# Patient Record
Sex: Male | Born: 1955 | Hispanic: Yes | Marital: Single | Smoking: Current some day smoker
Health system: Southern US, Community
[De-identification: ages and names within clinical notes are randomized; demographics above are authoritative.]

## PROBLEM LIST (undated history)

## (undated) DIAGNOSIS — Z789 Other specified health status: Secondary | ICD-10-CM

## (undated) DIAGNOSIS — E785 Hyperlipidemia, unspecified: Secondary | ICD-10-CM

## (undated) DIAGNOSIS — I639 Cerebral infarction, unspecified: Secondary | ICD-10-CM

## (undated) DIAGNOSIS — I10 Essential (primary) hypertension: Secondary | ICD-10-CM

## (undated) HISTORY — DX: Cerebral infarction, unspecified: I63.9

## (undated) HISTORY — PX: NO PAST SURGERIES: SHX2092

---

## 2016-04-13 ENCOUNTER — Observation Stay: Payer: Self-pay

## 2016-04-13 ENCOUNTER — Inpatient Hospital Stay
Admission: EM | Admit: 2016-04-13 | Discharge: 2016-04-15 | DRG: 068 | Disposition: A | Discharge status: Other Acute Inpatient Hospital | Payer: Self-pay | Attending: Internal Medicine | Admitting: Internal Medicine

## 2016-04-13 ENCOUNTER — Emergency Department: Payer: Self-pay

## 2016-04-13 ENCOUNTER — Encounter: Payer: Self-pay | Admitting: Emergency Medicine

## 2016-04-13 DIAGNOSIS — G459 Transient cerebral ischemic attack, unspecified: Secondary | ICD-10-CM | POA: Diagnosis present

## 2016-04-13 DIAGNOSIS — R200 Anesthesia of skin: Secondary | ICD-10-CM

## 2016-04-13 DIAGNOSIS — I6621 Occlusion and stenosis of right posterior cerebral artery: Principal | ICD-10-CM | POA: Diagnosis present

## 2016-04-13 DIAGNOSIS — I63531 Cerebral infarction due to unspecified occlusion or stenosis of right posterior cerebral artery: Secondary | ICD-10-CM | POA: Diagnosis present

## 2016-04-13 DIAGNOSIS — E785 Hyperlipidemia, unspecified: Secondary | ICD-10-CM | POA: Diagnosis present

## 2016-04-13 DIAGNOSIS — I10 Essential (primary) hypertension: Secondary | ICD-10-CM | POA: Diagnosis present

## 2016-04-13 DIAGNOSIS — I639 Cerebral infarction, unspecified: Secondary | ICD-10-CM | POA: Diagnosis present

## 2016-04-13 DIAGNOSIS — Z823 Family history of stroke: Secondary | ICD-10-CM

## 2016-04-13 DIAGNOSIS — F17200 Nicotine dependence, unspecified, uncomplicated: Secondary | ICD-10-CM | POA: Diagnosis present

## 2016-04-13 DIAGNOSIS — R29898 Other symptoms and signs involving the musculoskeletal system: Secondary | ICD-10-CM

## 2016-04-13 DIAGNOSIS — G8194 Hemiplegia, unspecified affecting left nondominant side: Secondary | ICD-10-CM | POA: Diagnosis present

## 2016-04-13 HISTORY — DX: Other specified health status: Z78.9

## 2016-04-13 LAB — DIFFERENTIAL
BASOS PCT: 1 %
Basophils Absolute: 0 10*3/uL (ref 0–0.1)
EOS ABS: 0.5 10*3/uL (ref 0–0.7)
EOS PCT: 7 %
Lymphocytes Relative: 23 %
Lymphs Abs: 1.6 10*3/uL (ref 1.0–3.6)
Monocytes Absolute: 0.7 10*3/uL (ref 0.2–1.0)
Monocytes Relative: 9 %
NEUTROS PCT: 60 %
Neutro Abs: 4.3 10*3/uL (ref 1.4–6.5)

## 2016-04-13 LAB — TROPONIN I: Troponin I: <0.03 ng/mL (ref <0.031)

## 2016-04-13 LAB — COMPREHENSIVE METABOLIC PANEL
ALBUMIN: 4.2 g/dL (ref 3.5–5.0)
ALT: 21 U/L (ref 17–63)
ANION GAP: 6 (ref 5–15)
AST: 23 U/L (ref 15–41)
Alkaline Phosphatase: 69 U/L (ref 38–126)
BUN: 20 mg/dL (ref 6–20)
CO2: 24 mmol/L (ref 22–32)
Calcium: 8.9 mg/dL (ref 8.9–10.3)
Chloride: 108 mmol/L (ref 101–111)
Creatinine, Ser: 1.18 mg/dL (ref 0.61–1.24)
GFR calc Af Amer: >60 mL/min (ref >60)
GFR calc non Af Amer: >60 mL/min (ref >60)
GLUCOSE: 120 mg/dL — AB (ref 65–99)
POTASSIUM: 3.7 mmol/L (ref 3.5–5.1)
SODIUM: 138 mmol/L (ref 135–145)
Total Bilirubin: 0.9 mg/dL (ref 0.3–1.2)
Total Protein: 7.6 g/dL (ref 6.5–8.1)

## 2016-04-13 LAB — CBC
HCT: 45.3 % (ref 40.0–52.0)
Hemoglobin: 15.6 g/dL (ref 13.0–18.0)
MCH: 31.1 pg (ref 26.0–34.0)
MCHC: 34.3 g/dL (ref 32.0–36.0)
MCV: 90.5 fL (ref 80.0–100.0)
PLATELETS: 181 10*3/uL (ref 150–440)
RBC: 5.01 MIL/uL (ref 4.40–5.90)
RDW: 13.5 % (ref 11.5–14.5)
WBC: 7.1 10*3/uL (ref 3.8–10.6)

## 2016-04-13 LAB — PROTIME-INR
INR: 0.96
PROTHROMBIN TIME: 13 s (ref 11.4–15.0)

## 2016-04-13 LAB — APTT: aPTT: 29 seconds (ref 24–36)

## 2016-04-13 LAB — ETHANOL: Alcohol, Ethyl (B): <5 mg/dL (ref <5)

## 2016-04-13 MED ORDER — ASPIRIN 81 MG PO CHEW
324.0000 mg | CHEWABLE_TABLET | Freq: Once | ORAL | Status: AC
  Administered 2016-04-13: 324 mg via ORAL
  Filled 2016-04-13: qty 4

## 2016-04-13 MED ORDER — ATORVASTATIN CALCIUM 20 MG PO TABS
40.0000 mg | ORAL_TABLET | Freq: Every day | ORAL | Status: DC
  Administered 2016-04-13: 40 mg via ORAL
  Filled 2016-04-13: qty 2

## 2016-04-13 MED ORDER — ONDANSETRON HCL 4 MG PO TABS: 4.0000 mg | ORAL_TABLET | Freq: Four times a day (QID) | ORAL | Status: DC | PRN

## 2016-04-13 MED ORDER — MORPHINE SULFATE (PF) 2 MG/ML IV SOLN: 2.0000 mg | INTRAVENOUS | Status: DC | PRN

## 2016-04-13 MED ORDER — ASPIRIN 325 MG PO TABS
325.0000 mg | ORAL_TABLET | Freq: Every day | ORAL | Status: DC
Start: 2016-04-13 — End: 2016-04-15
  Administered 2016-04-13 – 2016-04-15 (×3): 325 mg via ORAL
  Filled 2016-04-13 (×3): qty 1

## 2016-04-13 MED ORDER — ONDANSETRON HCL 4 MG/2ML IJ SOLN: 4.0000 mg | Freq: Four times a day (QID) | INTRAMUSCULAR | Status: DC | PRN

## 2016-04-13 MED ORDER — ACETAMINOPHEN 650 MG RE SUPP: 650.0000 mg | Freq: Four times a day (QID) | RECTAL | Status: DC | PRN

## 2016-04-13 MED ORDER — ENOXAPARIN SODIUM 40 MG/0.4ML ~~LOC~~ SOLN
40.0000 mg | SUBCUTANEOUS | Status: DC
  Administered 2016-04-13 – 2016-04-15 (×3): 40 mg via SUBCUTANEOUS
  Filled 2016-04-13 (×3): qty 0.4

## 2016-04-13 MED ORDER — OXYCODONE HCL 5 MG PO TABS
5.0000 mg | ORAL_TABLET | ORAL | Status: DC | PRN
  Administered 2016-04-13: 5 mg via ORAL
  Filled 2016-04-13: qty 1

## 2016-04-13 MED ORDER — SODIUM CHLORIDE 0.9% FLUSH
3.0000 mL | Freq: Two times a day (BID) | INTRAVENOUS | Status: DC
  Administered 2016-04-13 – 2016-04-15 (×5): 3 mL via INTRAVENOUS

## 2016-04-13 MED ORDER — STROKE: EARLY STAGES OF RECOVERY BOOK
Freq: Once | Status: AC
  Administered 2016-04-13: 10:00:00

## 2016-04-13 MED ORDER — ASPIRIN 300 MG RE SUPP: 300.0000 mg | Freq: Every day | RECTAL | Status: DC

## 2016-04-13 MED ORDER — ACETAMINOPHEN 325 MG PO TABS
650.0000 mg | ORAL_TABLET | Freq: Four times a day (QID) | ORAL | Status: DC | PRN
  Administered 2016-04-14: 23:00:00 650 mg via ORAL
  Filled 2016-04-13: qty 2

## 2016-04-13 NOTE — ED Provider Notes (Signed)
[facility] Emergency Department Provider Note  ____________________________________________  Time seen: Approximately 402 AM  I have reviewed the triage vital signs and the nursing notes.   HISTORY  Chief Complaint Weakness and Dizziness    HPI Victor Calhoun is a 60 y.o. male who comes into the hospital today with some left-sided weakness. The patient reports that he feels as though his left side is sleeping. The symptoms are yesterday at 10 AM. He was last normal Saturday night. He thought it would go away so he stayed home. The patient has had some left-sided weakness as well. When he picks things up it just falls. He also reports that when he walks his leg went under him. The patient has not had any slurred speech or facial droop. He has been drinking this weekend quite a few beers. He reports that his hand and his legs feel throbbing but he is not having any pain at this time.The patient's family was concerned as he has a significant family history of stroke. The patient is here for evaluation.   History reviewed. No pertinent past medical history.  There are no active problems to display for this patient.   History reviewed. No pertinent past surgical history.  No current outpatient prescriptions on file.  Allergies Review of patient's allergies indicates no known allergies.  History reviewed. No pertinent family history.  Social History Social History  Substance Use Topics  . Smoking status: Current Some Day Smoker  . Smokeless tobacco: None  . Alcohol Use: Yes    Review of Systems Constitutional: No fever/chills Eyes: No visual changes. ENT: No sore throat. Cardiovascular: Denies chest pain. Respiratory: Denies shortness of breath. Gastrointestinal: No abdominal pain.  No nausea, no vomiting.  No diarrhea.  No constipation. Genitourinary: Negative for dysuria. Musculoskeletal: Negative for back pain. Skin: Negative for  rash. Neurological: Left arm numbness and weakness.  10-point ROS otherwise negative.  ____________________________________________   PHYSICAL EXAM:  VITAL SIGNS: ED Triage Vitals  Enc Vitals Group     BP 04/13/16 0402 160/107 mmHg     Pulse Rate 04/13/16 0402 86     Resp 04/13/16 0402 21     Temp 04/13/16 0402 98.3 F (36.8 C)     Temp Source 04/13/16 0402 Oral     SpO2 04/13/16 0402 96 %     Weight 04/13/16 0402 175 lb (79.379 kg)     Height 04/13/16 0402 5\' 8"  (1.727 m)     Head Cir --      Peak Flow --      Pain Score 04/13/16 0405 6     Pain Loc --      Pain Edu? --      Excl. in [place]? --     Constitutional: Alert and oriented. Well appearing and in no acute distress. Eyes: Conjunctivae are normal. PERRL. EOMI. Head: Atraumatic. Nose: No congestion/rhinnorhea. Mouth/Throat: Mucous membranes are moist.  Oropharynx non-erythematous. Cardiovascular: Normal rate, regular rhythm. Grossly normal heart sounds.  Good peripheral circulation. Respiratory: Normal respiratory effort.  No retractions. Lungs CTAB. Gastrointestinal: Soft and nontender. No distention. Positive bowel sounds Musculoskeletal: No lower extremity tenderness nor edema.   Neurologic:  Normal speech and language. Cranial nerves II through XII are grossly intact. The patient has some decreased sensation in his left upper extremity with some decreased grip and some left-sided pronator drift. The patient also has some mild dysmetria on the left as well. The patient has no lower extremity pronator drift  or numbness. Speech is clear and intact. Skin:  Skin is warm, dry and intact. Psychiatric: Mood and affect are normal.   ____________________________________________   LABS (all labs ordered are listed, but only abnormal results are displayed)  Labs Reviewed  COMPREHENSIVE METABOLIC PANEL - Abnormal; Notable for the following:    Glucose, Bld 120 (*)    All other components within normal limits  PROTIME-INR   APTT  CBC  DIFFERENTIAL  TROPONIN I  ETHANOL  TROPONIN I  CBG MONITORING, ED   ____________________________________________  EKG  ED ECG REPORT I, Loney Hering, the attending physician, personally viewed and interpreted this ECG.   Date: 04/13/2016  EKG Time: 542  Rate: 88  Rhythm: normal sinus rhythm  Axis: normal  Intervals:none  ST&T Change: normal  ____________________________________________  RADIOLOGY  CT head: No acute intracranial abnormalities, mild chronic atrophy and small vessel ischemic changes. ____________________________________________   PROCEDURES  Procedure(s) performed: None  Critical Care performed: No  ____________________________________________   INITIAL IMPRESSION / ASSESSMENT AND PLAN / ED COURSE  Pertinent labs & imaging results that were available during my care of the patient were reviewed by me and considered in my medical decision making (see chart for details).  This is a 60 year old male who comes into the hospital today with some left-sided numbness and weakness. The patient had been drinking and woke up feeling dizzy and unsteady and having the left-sided weakness. The patient had some blood work that was unremarkable including an ethanol. The patient's CT scan does not show any areas of infarct at this time but I feel the patient is being admitted to the hospital for further evaluation and neurologic workup. The patient does have an NIH stroke scale of 3 but as his symptoms are greater than 12 hours old he is outside of the code stroke window. The patient will receive an aspirin. He is outside of the window to receive TPA at this time.  The patient will be admitted to the hospitalist service for further evaluation and workup. ____________________________________________   FINAL CLINICAL IMPRESSION(S) / ED DIAGNOSES  Final diagnoses:  Left arm weakness  Left arm numbness      Loney Hering, MD 04/13/16 [phone]

## 2016-04-13 NOTE — ED Notes (Signed)
Pt states he has some improve, denies HA at present, that he had on arrival.. No noted facial droop.Victor Calhoun

## 2016-04-13 NOTE — Plan of Care (Signed)
Problem: Education: Goal: Knowledge of disease or condition will improve Outcome: Progressing Present on admission numbess in LUE and LLE 04/13/16

## 2016-04-13 NOTE — H&P (Signed)
[facility] [place] NAME: Victor Calhoun    MR#:  [id number]  DATE OF BIRTH:  [birth year]   DATE OF ADMISSION:  04/13/2016  PRIMARY CARE PHYSICIAN: No primary care provider on file.   REQUESTING/REFERRING PHYSICIAN: Dahlia Client  CHIEF COMPLAINT:   Chief Complaint  Patient presents with  . Weakness  . Dizziness    HISTORY OF PRESENT ILLNESS:  Victor Calhoun  is a 60 y.o. male without significant medical history presenting with left-sided weakness. Last known normal [id number] uncertain about time but prior to bed. Patient awoke 04/12/2016 after drinking several beers the night before with dizziness left-sided weakness. This includes arm and leg upper extremity more prominent than lower. Symptoms persisted but somewhat improved though not yet back to baseline. Denies further symptoms at this time other than weakness.  PAST MEDICAL HISTORY:   Past Medical History  Diagnosis Date  . Medical history non-contributory     PAST SURGICAL HISTORY:   Past Surgical History  Procedure Laterality Date  . No past surgeries      SOCIAL HISTORY:   Social History  Substance Use Topics  . Smoking status: Current Some Day Smoker  . Smokeless tobacco: Not on file  . Alcohol Use: Yes    FAMILY HISTORY:   Family History  Problem Relation Age of Onset  . Stroke Neg Hx     DRUG ALLERGIES:  No Known Allergies  REVIEW OF SYSTEMS:  REVIEW OF SYSTEMS:  CONSTITUTIONAL: Denies fevers, chills, fatigue, weakness.  EYES: Denies blurred vision, double vision, or eye pain.  EARS, NOSE, THROAT: Denies tinnitus, ear pain, hearing loss.  RESPIRATORY: denies cough, shortness of breath, wheezing  CARDIOVASCULAR: Denies chest pain, palpitations, edema.  GASTROINTESTINAL: Denies nausea, vomiting, diarrhea, abdominal pain.  GENITOURINARY: Denies dysuria, hematuria.  ENDOCRINE: Denies nocturia or thyroid problems. HEMATOLOGIC AND LYMPHATIC: Denies  easy bruising or bleeding.  SKIN: Denies rash or lesions.  MUSCULOSKELETAL: Denies pain in neck, back, shoulder, knees, hips, or further arthritic symptoms.  NEUROLOGIC: Denies paralysis, paresthesias. Positive left-sided weakness PSYCHIATRIC: Denies anxiety or depressive symptoms. Otherwise full review of systems performed by me is negative.   MEDICATIONS AT HOME:   Prior to Admission medications   Not on File      VITAL SIGNS:  Blood pressure 156/113, pulse 79, temperature 98.3 F (36.8 C), temperature source Oral, resp. rate 18, height 5\' 8"  (1.727 m), weight 79.379 kg (175 lb), SpO2 93 %.  PHYSICAL EXAMINATION:  VITAL SIGNS: Filed Vitals:   04/13/16 0800 04/13/16 0815  BP: 156/113   Pulse: 87 79  Temp:    Resp: 24 18   GENERAL:59 y.o.male currently in no acute distress.  HEAD: Normocephalic, atraumatic.  EYES: Pupils equal, round, reactive to light. Extraocular muscles intact. No scleral icterus.  MOUTH: Moist mucosal membrane. Dentition intact. No abscess noted.  EAR, NOSE, THROAT: Clear without exudates. No external lesions.  NECK: Supple. No thyromegaly. No nodules. No JVD.  PULMONARY: Clear to ascultation, without wheeze rails or rhonci. No use of accessory muscles, Good respiratory effort. good air entry bilaterally CHEST: Nontender to palpation.  CARDIOVASCULAR: S1 and S2. Regular rate and rhythm. No murmurs, rubs, or gallops. No edema. Pedal pulses 2+ bilaterally.  GASTROINTESTINAL: Soft, nontender, nondistended. No masses. Positive bowel sounds. No hepatosplenomegaly.  MUSCULOSKELETAL: No swelling, clubbing, or edema. Range of motion full in all extremities.  NEUROLOGIC: Cranial nerves II through XII are intact. Sensation intact. Reflexes intact. Positive left-sided  pronator drift, upper extremity strength left-sided 4 minus/5 including proximal and distal flexion and extension remainder of extremities 5/5, gait deferred at this time SKIN: No ulceration, lesions,  rashes, or cyanosis. Skin warm and dry. Turgor intact.  PSYCHIATRIC: Mood, affect within normal limits. The patient is awake, alert and oriented x 3. Insight, judgment intact.    LABORATORY PANEL:   CBC  Recent Labs Lab 04/13/16 0417  WBC 7.1  HGB 15.6  HCT 45.3  PLT 181   ------------------------------------------------------------------------------------------------------------------  Chemistries   Recent Labs Lab 04/13/16 0417  NA 138  K 3.7  CL 108  CO2 24  GLUCOSE 120*  BUN 20  CREATININE 1.18  CALCIUM 8.9  AST 23  ALT 21  ALKPHOS 69  BILITOT 0.9   ------------------------------------------------------------------------------------------------------------------  Cardiac Enzymes  Recent Labs Lab 04/13/16 0725  TROPONINI <0.03   ------------------------------------------------------------------------------------------------------------------  RADIOLOGY:  Ct Head Wo Contrast  04/13/2016  CLINICAL DATA:  Heavy drinking on Saturday night then woke up Sunday morning feeling dizzy and unsteady. Left-sided weakness. EXAM: CT HEAD WITHOUT CONTRAST TECHNIQUE: Contiguous axial images were obtained from the base of the skull through the vertex without intravenous contrast. COMPARISON:  None. FINDINGS: Mild cerebral atrophy. Mild ventricular dilatation consistent with central atrophy. Patchy white matter changes in the deep white matter consistent with mild small vessel ischemia. No mass effect or midline shift. No abnormal extra-axial fluid collections. Gray-white matter junctions are distinct. Basal cisterns are not effaced. No evidence of acute intracranial hemorrhage. No depressed skull fractures. Mild mucosal thickening in the paranasal sinuses. Vascular calcifications. Mastoid air cells are not opacified. IMPRESSION: No acute intracranial abnormalities. Mild chronic atrophy and small vessel ischemic changes. Electronically Signed   By: Lucienne Capers M.D.   On:  04/13/2016 04:47    EKG:   Orders placed or performed during the hospital encounter of 04/13/16  . EKG 12-Lead  . EKG 12-Lead  . ED EKG  . ED EKG    IMPRESSION AND PLAN:   60 year old Hispanic gentleman-Spanish speaking presenting with acute onset left-sided weakness which occurred greater than 24 hours prior to presentation to hospital.  1. Strokelike symptoms: CVA, unspecified: Initiate aspirin/statin therapy, stroke protocol/precautions, place on telemetry, trend cardiac enzymes, check MRI brain, complete echocardiogram, lipid panel, hemoglobin A1c, bilateral carotid Doppler, permissive hypertension first 24 hours treating blood pressure only greater than 220/120,   2. Essential hypertension: Appears to be on no home medication for this permissive hypertension for now however will likely need to start medications tomorrow 3. Venous thromboembolism prophylactic: Lovenox  All the records are reviewed and case discussed with ED provider. Management plans discussed with the patient, family and they are in agreement.  CODE STATUS: Full  TOTAL TIME TAKING CARE OF THIS PATIENT: 33 minutes.    Hower,  Karenann Cai.D on 04/13/2016 at 8:34 AM  Between 7am to 6pm - Pager - [phone]  After 6pm: House Pager: - [phone]  [facility]  Office  [phone]  CC: Primary care physician; No primary care provider on file.

## 2016-04-13 NOTE — Progress Notes (Addendum)
Speech Therapy Note: received order, reviewed chart notes. Met w/ pt, family and NSG w/ the Interpreter in room. Pt denied any difficulty w/ swallowing; pt passed his swallow screen in the ED and has a regular diet ordered per chart. Pt communicated appropriately and easily in Spanish w/ the Interpreter and family denying any difficulty w/ his language and speech skills. No skilled ST services indicated at this time as pt appears at his baseline. Pt agreed. NSG to reconsult if any change in status while admitted.

## 2016-04-13 NOTE — ED Notes (Addendum)
Pt speaks spanish only; girlfriend, who is bilingual, says pt was drinking heavily Saturday night->7 beers; woke Sunday morning around 10 am feeling dizzy, unsteady on his feet, and with left sided weakness; difficulty holding things with his left hand; (interpreter arrived during triage) pt reports left arm heaviness and left leg weakness; mild headache; pt reports extensive family history of CVA;

## 2016-04-14 ENCOUNTER — Observation Stay: Payer: Self-pay

## 2016-04-14 ENCOUNTER — Observation Stay (HOSPITAL_COMMUNITY)
Admit: 2016-04-14 | Discharge: 2016-04-14 | Disposition: A | Discharge status: Home / Self Care | Payer: Self-pay | Attending: Internal Medicine | Admitting: Internal Medicine

## 2016-04-14 DIAGNOSIS — I6350 Cerebral infarction due to unspecified occlusion or stenosis of unspecified cerebral artery: Secondary | ICD-10-CM

## 2016-04-14 DIAGNOSIS — I639 Cerebral infarction, unspecified: Secondary | ICD-10-CM | POA: Diagnosis present

## 2016-04-14 LAB — ECHOCARDIOGRAM COMPLETE
Height: 66 in
Weight: 2908.8 oz

## 2016-04-14 LAB — LIPID PANEL
CHOL/HDL RATIO: 6.3 ratio
Cholesterol: 165 mg/dL (ref 0–200)
HDL: 26 mg/dL — AB (ref >40)
LDL CALC: UNDETERMINED mg/dL (ref 0–99)
Triglycerides: 725 mg/dL — ABNORMAL HIGH (ref <150)
VLDL: UNDETERMINED mg/dL (ref 0–40)

## 2016-04-14 LAB — HEMOGLOBIN A1C: Hgb A1c MFr Bld: 5.7 % (ref 4.0–6.0)

## 2016-04-14 MED ORDER — METOPROLOL TARTRATE 25 MG PO TABS
25.0000 mg | ORAL_TABLET | Freq: Two times a day (BID) | ORAL | Status: DC
  Administered 2016-04-14 – 2016-04-15 (×3): 25 mg via ORAL
  Filled 2016-04-14 (×3): qty 1

## 2016-04-14 MED ORDER — ATORVASTATIN CALCIUM 20 MG PO TABS: 40.0000 mg | ORAL_TABLET | Freq: Every day | ORAL | Status: DC

## 2016-04-14 NOTE — Progress Notes (Signed)
*  PRELIMINARY RESULTS* Echocardiogram 2D Echocardiogram has been performed.  Victor Calhoun 04/14/2016, 9:16 AM

## 2016-04-14 NOTE — Progress Notes (Signed)
PT Cancellation Note  Patient Details Name: Victor Calhoun MRN: [id number] DOB: 1956-06-27   Cancelled Treatment:    Reason Eval/Treat Not Completed: Patient at procedure or test/unavailable.  Pt currently off floor for testing.  Will re-attempt PT eval at a later date/time.   Raquel Sarna Caroljean Monsivais 04/14/2016, 10:24 AM Leitha Bleak, [place]

## 2016-04-14 NOTE — Progress Notes (Signed)
[facility] [place] NAME: Victor Calhoun    MR#:  [id number]  DATE OF BIRTH:  1956-02-13  SUBJECTIVE:  CHIEF COMPLAINT:   Chief Complaint  Patient presents with  . Weakness  . Dizziness     Still have left UL numbness and weakness.    Need support to walk. REVIEW OF SYSTEMS:  CONSTITUTIONAL: No fever, fatigue or weakness.  EYES: No blurred or double vision.  EARS, NOSE, AND THROAT: No tinnitus or ear pain.  RESPIRATORY: No cough, shortness of breath, wheezing or hemoptysis.  CARDIOVASCULAR: No chest pain, orthopnea, edema.  GASTROINTESTINAL: No nausea, vomiting, diarrhea or abdominal pain.  GENITOURINARY: No dysuria, hematuria.  ENDOCRINE: No polyuria, nocturia,  HEMATOLOGY: No anemia, easy bruising or bleeding SKIN: No rash or lesion. MUSCULOSKELETAL: No joint pain or arthritis.   NEUROLOGIC: No tingling, left Upper limb numbness, weakness.  PSYCHIATRY: No anxiety or depression.   ROS  DRUG ALLERGIES:  No Known Allergies  VITALS:  Blood pressure 118/68, pulse 87, temperature 98.4 F (36.9 C), temperature source Oral, resp. rate 18, height 5\' 6"  (1.676 m), weight 82.464 kg (181 lb 12.8 oz), SpO2 93 %.  PHYSICAL EXAMINATION:  GENERAL:  60 y.o.-year-old patient lying in the bed with no acute distress.  EYES: Pupils equal, round, reactive to light and accommodation. No scleral icterus. Extraocular muscles intact.  HEENT: Head atraumatic, normocephalic. Oropharynx and nasopharynx clear.  NECK:  Supple, no jugular venous distention. No thyroid enlargement, no tenderness.  LUNGS: Normal breath sounds bilaterally, no wheezing, rales,rhonchi or crepitation. No use of accessory muscles of respiration.  CARDIOVASCULAR: S1, S2 normal. No murmurs, rubs, or gallops.  ABDOMEN: Soft, nontender, nondistended. Bowel sounds present. No organomegaly or mass.  EXTREMITIES: No pedal edema, cyanosis, or clubbing.  NEUROLOGIC: Cranial nerves II  through XII are intact. Muscle strength 5/5 in right side extremities. On left side 3/5 in upper limb and 4/5 in lower limb. Sensation intact. Gait not checked.  PSYCHIATRIC: The patient is alert and oriented x 3.  SKIN: No obvious rash, lesion, or ulcer.   Physical Exam LABORATORY PANEL:   CBC  Recent Labs Lab 04/13/16 0417  WBC 7.1  HGB 15.6  HCT 45.3  PLT 181   ------------------------------------------------------------------------------------------------------------------  Chemistries   Recent Labs Lab 04/13/16 0417  NA 138  K 3.7  CL 108  CO2 24  GLUCOSE 120*  BUN 20  CREATININE 1.18  CALCIUM 8.9  AST 23  ALT 21  ALKPHOS 69  BILITOT 0.9   ------------------------------------------------------------------------------------------------------------------  Cardiac Enzymes  Recent Labs Lab 04/13/16 0417 04/13/16 0725  TROPONINI <0.03 <0.03   ------------------------------------------------------------------------------------------------------------------  RADIOLOGY:  Ct Head Wo Contrast  04/13/2016  CLINICAL DATA:  Heavy drinking on Saturday night then woke up Sunday morning feeling dizzy and unsteady. Left-sided weakness. EXAM: CT HEAD WITHOUT CONTRAST TECHNIQUE: Contiguous axial images were obtained from the base of the skull through the vertex without intravenous contrast. COMPARISON:  None. FINDINGS: Mild cerebral atrophy. Mild ventricular dilatation consistent with central atrophy. Patchy white matter changes in the deep white matter consistent with mild small vessel ischemia. No mass effect or midline shift. No abnormal extra-axial fluid collections. Gray-white matter junctions are distinct. Basal cisterns are not effaced. No evidence of acute intracranial hemorrhage. No depressed skull fractures. Mild mucosal thickening in the paranasal sinuses. Vascular calcifications. Mastoid air cells are not opacified. IMPRESSION: No acute intracranial abnormalities.  Mild chronic atrophy and small vessel ischemic changes. Electronically Signed  By: Lucienne Capers M.D.   On: 04/13/2016 04:47   Mr Jodene Nam Head Wo Contrast  04/14/2016  CLINICAL DATA:  Stroke.  Left-sided weakness. EXAM: MRA HEAD WITHOUT CONTRAST TECHNIQUE: Angiographic images of the Circle of Willis were obtained using MRA technique without intravenous contrast. COMPARISON:  MRI head 04/13/2016 FINDINGS: Severe diffuse stenosis distal right vertebral artery with more normal caliber at the C1 level. This is likely due to atherosclerotic disease in the distal right vertebral artery. Left vertebral artery patent. Left PICA patent. Right PICA not visualized. Basilar widely patent. Occlusion of the proximal right posterior cerebral artery accounting for the acute infarct. Moderate stenosis in the mid left posterior cerebral artery. Superior cerebellar arteries are patent but small Internal carotid artery patent bilaterally. Mild stenosis left cavernous carotid. Hypoplastic right A1 segment. Both anterior cerebral arteries are patent and supplied from the left. Middle cerebral arteries patent bilaterally Negative for cerebral aneurysm. 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 Severe stenosis distal right vertebral artery. This is probably due to atherosclerotic disease and could be a source of embolus to the right posterior cerebral artery. Electronically Signed   By: Franchot Gallo M.D.   On: 04/14/2016 11:06   Mr Brain Wo Contrast  04/13/2016  CLINICAL DATA:  59 year old male with left side weakness (arm greater than leg) since last known to be normal on 04/11/2016. Dizziness. Initial encounter. EXAM: MRI HEAD WITHOUT CONTRAST TECHNIQUE: Multiplanar, multiecho pulse sequences of the brain and surrounding structures were obtained without intravenous contrast. COMPARISON:  Head CT 0428 hours today. FINDINGS:  15 mm curvilinear segment of restricted diffusion in the right thalamus tracking toward the posterior limb of the right internal capsule. Superimposed diffusion restriction throughout the right hippocampal formation (series 9, image 17). Minimal associated T2 and FLAIR hyperintensity. No associated hemorrhage or mass effect. Major intracranial vascular flow voids are preserved. No other parenchymal restricted diffusion. No signal abnormality elsewhere in the right temporal lobe. The right insula is normal. There is a superimposed 2 cm left operculum dural based lesion (series 12, image 15 and series 15, image 16) which was occult on the earlier CT and is most compatible with meningioma. There is mild regional mass effect on the left temporal and frontal operculum, but no associated cerebral edema. Evidence of a broad-based dural attachment on series 12. No contrast administered. No other intracranial mass lesion or mass effect. No ventriculomegaly or acute intracranial hemorrhage. Cervicomedullary junction and pituitary are within normal limits. Outside of the acute findings above, gray and white matter signal is within normal limits for age. No cortical encephalomalacia or chronic cerebral blood products identified. Visible internal auditory structures appear normal. Mastoids are clear. Trace paranasal sinus mucosal thickening. Negative orbit and scalp soft tissues. Visualized bone marrow signal is within normal limits. IMPRESSION: 1. Diffusion abnormality in the right thalamus and throughout the right hippocampal formation, favor this is all post ischemic such as from right PCA territory small vessel disease. No associated hemorrhage or mass effect. 2. Superimposed 2 cm left lateral convexity probable meningioma at the level of the left operculum. Mild regional mass effect with no cerebral edema. Burtis Junes this is clinically silent. Electronically Signed   By: Genevie Ann M.D.   On: 04/13/2016 14:21   US Carotid  Bilateral  04/14/2016  CLINICAL DATA:  TIA.  Blurred vision.  Previous tobacco abuse. EXAM: BILATERAL CAROTID DUPLEX ULTRASOUND TECHNIQUE: Pearline Cables scale imaging,  color Doppler and duplex ultrasound was performed of bilateral carotid and vertebral arteries in the neck. COMPARISON:  None. REVIEW OF SYSTEMS: Quantification of carotid stenosis is based on velocity parameters that correlate the residual internal carotid diameter with NASCET-based stenosis levels, using the diameter of the distal internal carotid lumen as the denominator for stenosis measurement. The following velocity measurements were obtained: PEAK SYSTOLIC/END DIASTOLIC RIGHT ICA:                     89/26cm/sec CCA:                     [id number] SYSTOLIC ICA/CCA RATIO:  0.9 DIASTOLIC ICA/CCA RATIO: 0.9 ECA:                     79cm/sec LEFT ICA:                     75/32cm/sec CCA:                     [id number] SYSTOLIC ICA/CCA RATIO:  0.9 DIASTOLIC ICA/CCA RATIO: 1.4 ECA:                     89cm/sec FINDINGS: RIGHT CAROTID ARTERY: No significant plaque or stenosis. Normal waveforms and color Doppler signal. RIGHT VERTEBRAL ARTERY:  Normal flow direction and waveform. LEFT CAROTID ARTERY: No significant plaque or stenosis. Normal waveforms and color Doppler signal. LEFT VERTEBRAL ARTERY: Normal flow direction and waveform. IMPRESSION: 1. No significant carotid plaque or stenosis. Electronically Signed   By: Lucrezia Europe M.D.   On: 04/14/2016 10:20    ASSESSMENT AND PLAN:   Active Problems:   TIA (transient ischemic attack)   Acute CVA (cerebrovascular accident) ([place])  * Acute stroke   MRA confirmed.   TG and LDL high.   BP high   Started on meds for better control.   ASA.   PT and OT eval.   Carotid doppler does nto show any source of stroke, Echo done.  * Uncontrolled Htn   Started on metoprolol.  * Hyperlipidemia   Added atorvastatin    All the records are reviewed and case discussed with Care Management/Social  Workerr. Management plans discussed with the patient, family and they are in agreement.  CODE STATUS: Full.  TOTAL TIME TAKING CARE OF THIS PATIENT: 35 minutes.     POSSIBLE D/C IN 1-2 DAYS, DEPENDING ON CLINICAL CONDITION.   Vaughan Basta M.D on 04/14/2016   Between 7am to 6pm - Pager - [phone]  After 6pm go to [email or web address] - password [email or web address] [facility]  Office  [phone]  CC: Primary care physician; No primary care provider on file.  Note: This dictation was prepared with Dragon dictation along with smaller phrase technology. Any transcriptional errors that result from this process are unintentional.

## 2016-04-14 NOTE — Evaluation (Signed)
Occupational Therapy Evaluation Patient Details Name: Victor Calhoun MRN: [id number] DOB: 06/19/1956 Today's Date: 04/14/2016    History of Present Illness Victor Calhoun is a 60 y.o. male without significant medical history presenting with left-sided weakness. Last known normal [id number] uncertain about time but prior to bed. Patient awoke 04/12/2016 after drinking several beers the night before with dizziness left-sided weakness. This includes arm and leg upper extremity more prominent than lower. Symptoms persisted but somewhat improved though not yet back to baseline   Clinical Impression   Pt. Is a 60 y.o. Male who was admitted to [facility] with left sided weakness. Pt. Presents with impaired LUE proprioceptive awareness, weakness, coordination, and hand function which hinders his ability to complete ADL and IADL tasks. Pt. Could benefit from skilled OT services to improve LUE functioning for use during ADL and IADL tasks.    Follow Up Recommendations  CIR    Equipment Recommendations       Recommendations for Other Services       Precautions / Restrictions        Mobility Bed Mobility Overal bed mobility: Needs Assistance Bed Mobility: Supine to Sit     Supine to sit: Min guard        Transfers                      Balance Overall balance assessment: Needs assistance   Sitting balance-Leahy Scale: Good (Good static, Fair dynmaic sitting balance when challeged to the left)                                      ADL Overall ADL's : Needs assistance/impaired Eating/Feeding: Set up;Minimal assistance   Grooming: Minimal assistance;Set up               Lower Body Dressing: Moderate assistance                 General ADL Comments: Pt. presents with difficulty performing ADL tasks that require bilateral hand use, and has difficulty incorporating his LUE and hand into the ADL tasks.     Vision     Perception      Praxis      Pertinent Vitals/Pain Pain Assessment: 0-10 Pain Score: 2  Pain Location: Left shoulder. Pain Descriptors / Indicators: Tingling     Hand Dominance Right   Extremity/Trunk Assessment Upper Extremity Assessment Upper Extremity Assessment: LUE deficits/detail LUE Deficits / Details: Left shoulder flexion abduction 3/5, elbow flexion 3/5, extension 3+/5, wrist extension 3/5.  LUE: Unable to fully assess due to pain LUE Sensation: decreased light touch;decreased proprioception LUE Coordination: decreased fine motor;decreased gross motor           Communication Communication Communication: Prefers language other than English (Spanish Interprter present.)   Cognition                           General Comments       Exercises       Shoulder Instructions      Home Living Family/patient expects to be discharged to:: Private residence Living Arrangements: Alone;Spouse/significant other;Other relatives Available Help at Discharge: Family Type of Home: Apartment Home Access: Level entry           Bathroom Shower/Tub: Tub/shower unit;Curtain Shower/tub characteristics: Architectural technologist: Standard     Home Equipment: None  Prior Functioning/Environment Level of Independence: Independent        Comments: Working in [employer], driving.    OT Diagnosis: Generalized weakness;Apraxia   OT Problem List: Decreased strength;Decreased activity tolerance;Decreased coordination;Decreased safety awareness;Decreased knowledge of use of DME or AE;Impaired UE functional use   OT Treatment/Interventions: Self-care/ADL training;Therapeutic exercise;Therapeutic activities;Neuromuscular education;Energy conservation;DME and/or AE instruction;Balance training    OT Goals(Current goals can be found in the care plan section) Acute Rehab OT Goals Patient Stated Goal: To regain use of his LUE OT Goal Formulation: With patient Time For Goal  Achievement: 04/28/16 Potential to Achieve Goals: Good  OT Frequency: Min 1X/week   Barriers to D/C:            Co-evaluation              End of Session    Activity Tolerance: Patient tolerated treatment well Patient left: in bed;with call bell/phone within reach;with bed alarm set   Time: 1100-1127 OT Time Calculation (min): 27 min Charges:  OT General Charges $OT Visit: 1 Procedure OT Evaluation $OT Eval Moderate Complexity: 1 Procedure G-Codes:    Harrel Carina, MS, OTR/L Harrel Carina 04/14/2016, 12:35 PM

## 2016-04-14 NOTE — Evaluation (Signed)
Physical Therapy Evaluation Patient Details Name: Victor Calhoun MRN: [id number] DOB: 1956-08-08 Today's Date: 04/14/2016   History of Present Illness  Pt is a 60 y.o. male presenting to hospital with L sided weakness (UE>LE) and dizziness.  Imaging showing acute infarct R thalamus and R hippocampus; occlusion R PCA; and severe stenosis distal R vertebral artery.    Clinical Impression  Prior to admission, pt was independent and working Architect.  Pt lives with roommate but roommate is currently out of town working (pt's girlfriend able to assist pt until next Thursday when she has surgery).  Currently pt is min assist with transfers and ambulation with RW 60 feet.  Pt required assist for L hand placement on RW.  During gait, pt with increased L lateral sway and towards end of ambulation pt with loss of balance to R requiring assist to maintain balance; decreased L push-off and pt catching L forefoot on ground consistently each step with decreased L hip flexion AROM noted.  Pt would benefit from skilled PT to address noted impairments and functional limitations.  Recommend pt discharge to CIR when medically appropriate (CM notified).     Follow Up Recommendations CIR    Equipment Recommendations   (RW pending further assessment)    Recommendations for Other Services Rehab consult     Precautions / Restrictions Precautions Precautions: Fall Restrictions Weight Bearing Restrictions: No      Mobility  Bed Mobility Overal bed mobility: Needs Assistance Bed Mobility: Supine to Sit     Supine to sit: Supervision;HOB elevated     General bed mobility comments: SBA for safety  Transfers Overall transfer level: Needs assistance Equipment used: Rolling walker (2 wheeled) Transfers: Sit to/from Stand Sit to Stand: Min assist         General transfer comment: vc's required for hand and foot placement and transfer technique/walker use (pt required tactile cues for L hand  placement on RW)  Ambulation/Gait Ambulation/Gait assistance: Min assist;+2 safety/equipment (chair follow) Ambulation Distance (Feet): 60 Feet Assistive device: Rolling walker (2 wheeled)   Gait velocity: mildly decreased   General Gait Details: increased L lateral sway and towards end of ambulation loss of balance to R requiring assist to maintain balance; decreased L push-off and pt catching L forefoot on ground consistently each step (just prior to swing phase) with decreased L hip flexion AROM noted during swing phase  Stairs            Wheelchair Mobility    Modified Rankin (Stroke Patients Only)       Balance Overall balance assessment: Needs assistance Sitting-balance support: Bilateral upper extremity supported;Feet supported Sitting balance-Leahy Scale: Good     Standing balance support: Bilateral upper extremity supported;During functional activity Standing balance-Leahy Scale: Poor Standing balance comment: unsteady with ambulation                             Pertinent Vitals/Pain Pain Assessment: 0-10 Pain Score: 2  Pain Location: L shoulder; L UE Pain Descriptors / Indicators: Tingling;Pins and needles Pain Intervention(s): Limited activity within patient's tolerance;Monitored during session;Repositioned  Vitals stable and WFL throughout treatment session.    Home Living Family/patient expects to be discharged to:: Private residence Living Arrangements: Alone;Non-relatives/Friends (Pt lives with roommate but roommate currently working in [facility] [employer] and not around to assist) Available Help at Discharge:  (Pt's girlfriend able to assist until next Thursday when she is going to have surgery) Type of  Home: Apartment Home Access: Level entry     Home Layout: One level Home Equipment: None      Prior Function Level of Independence: Independent         Comments: Working in Architect, driving.     Hand Dominance         Extremity/Trunk Assessment   Upper Extremity Assessment: Defer to OT evaluation    Decreased fine motor skills L hand       Lower Extremity Assessment: LLE deficits/detail (R LE WFL for strength, coordination, proprioception, and sensation)   LLE Deficits / Details: hip flexion 3/5; knee extension 4/5; knee flexion 3+/5; DF 3+/5; PF at least 3/5 (impaired AROM initiation L hamstrings and L DF)  Cervical / Trunk Assessment: Normal  Communication   Communication: Prefers language other than [place];Interpreter utilized ([place] majority of session (switched to spanish interpreter Leola [place] towards end of session))  Cognition Arousal/Alertness: Awake/alert Behavior During Therapy: WFL for tasks assessed/performed Overall Cognitive Status: Within Functional Limits for tasks assessed                      General Comments   Nursing cleared pt for participation in physical therapy.  Pt agreeable to PT session. Pt's girlfriend present entire session.    Exercises  gait and transfer training      Assessment/Plan    PT Assessment Patient needs continued PT services  PT Diagnosis Difficulty walking;Abnormality of gait;Hemiplegia non-dominant side   PT Problem List Decreased strength;Decreased activity tolerance;Decreased balance;Decreased mobility;Decreased knowledge of use of DME;Decreased knowledge of precautions;Impaired sensation;Pain  PT Treatment Interventions DME instruction;Gait training;Stair training;Functional mobility training;Therapeutic activities;Therapeutic exercise;Balance training;Neuromuscular re-education;Patient/family education   PT Goals (Current goals can be found in the Care Plan section) Acute Rehab PT Goals Patient Stated Goal: To be able to walk independently again PT Goal Formulation: With patient Time For Goal Achievement: 04/28/16 Potential to Achieve Goals: Good    Frequency 7X/week   Barriers to discharge Decreased  caregiver support      Co-evaluation               End of Session Equipment Utilized During Treatment: Gait belt Activity Tolerance: Patient tolerated treatment well Patient left: in chair;with call bell/phone within reach;with chair alarm set;with family/visitor present Nurse Communication: Mobility status;Precautions         Time: [id number] PT Time Calculation (min) (ACUTE ONLY): 55 min   Charges:   PT Evaluation $PT Eval Moderate Complexity: 1 Procedure PT Treatments $Gait Training: 8-22 mins $Therapeutic Activity: 8-22 mins   PT G CodesLeitha Bleak 05/03/2016, 4:54 PM Leitha Bleak, [place]

## 2016-04-15 ENCOUNTER — Encounter (HOSPITAL_COMMUNITY): Payer: Self-pay

## 2016-04-15 ENCOUNTER — Inpatient Hospital Stay (HOSPITAL_COMMUNITY)
Admission: RE | Admit: 2016-04-15 | Discharge: 2016-04-24 | DRG: 057 | Disposition: A | Discharge status: Home / Self Care | Payer: Self-pay | Source: Other Acute Inpatient Hospital | Attending: Physical Medicine & Rehabilitation | Admitting: Physical Medicine & Rehabilitation

## 2016-04-15 DIAGNOSIS — I6621 Occlusion and stenosis of right posterior cerebral artery: Secondary | ICD-10-CM | POA: Diagnosis present

## 2016-04-15 DIAGNOSIS — I69354 Hemiplegia and hemiparesis following cerebral infarction affecting left non-dominant side: Principal | ICD-10-CM

## 2016-04-15 DIAGNOSIS — I10 Essential (primary) hypertension: Secondary | ICD-10-CM | POA: Diagnosis present

## 2016-04-15 DIAGNOSIS — R531 Weakness: Secondary | ICD-10-CM | POA: Diagnosis present

## 2016-04-15 DIAGNOSIS — R42 Dizziness and giddiness: Secondary | ICD-10-CM | POA: Diagnosis present

## 2016-04-15 DIAGNOSIS — G441 Vascular headache, not elsewhere classified: Secondary | ICD-10-CM | POA: Diagnosis present

## 2016-04-15 DIAGNOSIS — Z720 Tobacco use: Secondary | ICD-10-CM | POA: Insufficient documentation

## 2016-04-15 DIAGNOSIS — R51 Headache: Secondary | ICD-10-CM

## 2016-04-15 DIAGNOSIS — G8190 Hemiplegia, unspecified affecting unspecified side: Secondary | ICD-10-CM | POA: Diagnosis present

## 2016-04-15 DIAGNOSIS — I639 Cerebral infarction, unspecified: Secondary | ICD-10-CM

## 2016-04-15 DIAGNOSIS — F17200 Nicotine dependence, unspecified, uncomplicated: Secondary | ICD-10-CM | POA: Diagnosis present

## 2016-04-15 DIAGNOSIS — E785 Hyperlipidemia, unspecified: Secondary | ICD-10-CM | POA: Diagnosis present

## 2016-04-15 DIAGNOSIS — I6350 Cerebral infarction due to unspecified occlusion or stenosis of unspecified cerebral artery: Secondary | ICD-10-CM

## 2016-04-15 DIAGNOSIS — IMO0002 Reserved for concepts with insufficient information to code with codable children: Secondary | ICD-10-CM

## 2016-04-15 DIAGNOSIS — Z79899 Other long term (current) drug therapy: Secondary | ICD-10-CM

## 2016-04-15 DIAGNOSIS — R270 Ataxia, unspecified: Secondary | ICD-10-CM | POA: Diagnosis present

## 2016-04-15 LAB — CBC
HEMATOCRIT: 48.5 % (ref 39.0–52.0)
Hemoglobin: 16.6 g/dL (ref 13.0–17.0)
MCH: 30.9 pg (ref 26.0–34.0)
MCHC: 34.2 g/dL (ref 30.0–36.0)
MCV: 90.1 fL (ref 78.0–100.0)
PLATELETS: 215 10*3/uL (ref 150–400)
RBC: 5.38 MIL/uL (ref 4.22–5.81)
RDW: 12.8 % (ref 11.5–15.5)
WBC: 7.3 10*3/uL (ref 4.0–10.5)

## 2016-04-15 LAB — CREATININE, SERUM
Creatinine, Ser: 1.16 mg/dL (ref 0.61–1.24)
GFR calc non Af Amer: >60 mL/min (ref >60)

## 2016-04-15 MED ORDER — ATORVASTATIN CALCIUM 40 MG PO TABS: 40.0000 mg | ORAL_TABLET | Freq: Every day | ORAL | Status: DC

## 2016-04-15 MED ORDER — ASPIRIN 300 MG RE SUPP
300.0000 mg | Freq: Every day | RECTAL | Status: DC
  Filled 2016-04-15: qty 1

## 2016-04-15 MED ORDER — METOPROLOL TARTRATE 25 MG PO TABS: 25.0000 mg | ORAL_TABLET | Freq: Two times a day (BID) | ORAL | Status: DC

## 2016-04-15 MED ORDER — ATORVASTATIN CALCIUM 40 MG PO TABS
40.0000 mg | ORAL_TABLET | Freq: Every day | ORAL | Status: DC
  Administered 2016-04-15 – 2016-04-23 (×9): 40 mg via ORAL
  Filled 2016-04-15 (×9): qty 1

## 2016-04-15 MED ORDER — SORBITOL 70 % SOLN: 30.0000 mL | Freq: Every day | Status: DC | PRN

## 2016-04-15 MED ORDER — BUTALBITAL-APAP-CAFFEINE 50-325-40 MG PO TABS: 1.0000 | ORAL_TABLET | Freq: Four times a day (QID) | ORAL | Status: DC | PRN

## 2016-04-15 MED ORDER — ENOXAPARIN SODIUM 40 MG/0.4ML ~~LOC~~ SOLN
40.0000 mg | SUBCUTANEOUS | Status: DC
  Administered 2016-04-16 – 2016-04-23 (×8): 40 mg via SUBCUTANEOUS
  Filled 2016-04-15 (×8): qty 0.4

## 2016-04-15 MED ORDER — ASPIRIN 325 MG PO TABS
325.0000 mg | ORAL_TABLET | Freq: Every day | ORAL | Status: DC
  Administered 2016-04-16 – 2016-04-24 (×9): 325 mg via ORAL
  Filled 2016-04-15 (×9): qty 1

## 2016-04-15 MED ORDER — BUTALBITAL-APAP-CAFFEINE 50-325-40 MG PO TABS
1.0000 | ORAL_TABLET | Freq: Four times a day (QID) | ORAL | Status: DC | PRN
  Administered 2016-04-15: 09:00:00 1 via ORAL
  Filled 2016-04-15 (×2): qty 1

## 2016-04-15 MED ORDER — ENOXAPARIN SODIUM 40 MG/0.4ML ~~LOC~~ SOLN: 40.0000 mg | SUBCUTANEOUS | Status: DC

## 2016-04-15 MED ORDER — ASPIRIN 325 MG PO TABS: 325.0000 mg | ORAL_TABLET | Freq: Every day | ORAL | Status: AC

## 2016-04-15 MED ORDER — ONDANSETRON HCL 4 MG PO TABS: 4.0000 mg | ORAL_TABLET | Freq: Four times a day (QID) | ORAL | Status: DC | PRN

## 2016-04-15 MED ORDER — BUTALBITAL-APAP-CAFFEINE 50-325-40 MG PO TABS
1.0000 | ORAL_TABLET | Freq: Four times a day (QID) | ORAL | Status: DC | PRN
  Administered 2016-04-19 – 2016-04-20 (×3): 1 via ORAL
  Filled 2016-04-15 (×3): qty 1

## 2016-04-15 MED ORDER — METOPROLOL TARTRATE 25 MG PO TABS
25.0000 mg | ORAL_TABLET | Freq: Two times a day (BID) | ORAL | Status: DC
  Administered 2016-04-15 – 2016-04-24 (×18): 25 mg via ORAL
  Filled 2016-04-15 (×18): qty 1

## 2016-04-15 MED ORDER — ACETAMINOPHEN 650 MG RE SUPP: 650.0000 mg | Freq: Four times a day (QID) | RECTAL | Status: DC | PRN

## 2016-04-15 MED ORDER — ONDANSETRON HCL 4 MG/2ML IJ SOLN: 4.0000 mg | Freq: Four times a day (QID) | INTRAMUSCULAR | Status: DC | PRN

## 2016-04-15 MED ORDER — ACETAMINOPHEN 325 MG PO TABS
650.0000 mg | ORAL_TABLET | Freq: Four times a day (QID) | ORAL | Status: DC | PRN
  Administered 2016-04-16 – 2016-04-23 (×7): 650 mg via ORAL
  Filled 2016-04-15 (×7): qty 2

## 2016-04-15 NOTE — H&P (View-Only) (Signed)
Physical Medicine and Rehabilitation Admission H&P    Chief Complaint  Patient presents with  . Weakness  . Dizziness  : HPI: 60 year old right-handed non-English speaking male from [place] with no significant medical history except tobacco abuse. By chart review patient lives alone but does have a roommate currently working in [facility] and cannot assist. He does have a girlfriend that can assist but is awaiting a surgery. One level apartment with level entry. 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 Initial cranial CT scan negative. MRI of the brain showed diffusion abnormality right thalamus and throughout right hippocampal formation. 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 Severe stenosis distal right vertebral artery. Patient did not receive TPA. Carotid Dopplers in no ICA stenosis. Echocardiogram with ejection fraction of 65% no wall motion abnormalities. Grade 1 diastolic dysfunction. Neurology consulted maintain on aspirin for CVA prophylaxis. Subcutaneous Lovenox for DVT prophylaxis. Currently maintained on a regular consistency diet. Lopressor has been added for blood pressure control. Reported bouts of headaches using Fioricet and monitor closely. Physical and occupational therapy evaluations completed 04/14/2016 with recommendations of physical medicine rehabilitation consult.  Review of Systems  Neurological: Positive for tingling, sensory change, weakness and headaches.  All other systems reviewed and are negative.  Past Medical History  Diagnosis Date  . Medical history non-contributory    Past Surgical History  Procedure Laterality Date  . No past surgeries     Family History  Problem  Relation Age of Onset  . Stroke Neg Hx    Social History:  reports that he has been smoking.  He does not have any smokeless tobacco history on file. He reports that he drinks alcohol. He reports that he does not use illicit drugs. Allergies: No Known Allergies No prescriptions prior to admission    Home: Home Living Family/patient expects to be discharged to:: Private residence Living Arrangements: Alone, Non-relatives/Friends (Pt lives with roommate but roommate currently working in [facility] and not around to assist) Available Help at Discharge:  (Pt's girlfriend able to assist until next Thursday when she is going to have surgery) Type of Home: Apartment Home Access: Level entry Home Layout: One level Bathroom Shower/Tub: Tub/shower unit, Architectural technologist: Standard Home Equipment: None   Functional History: Prior Function Level of Independence: Independent Comments: Working in Architect, driving.  Functional Status:  Mobility: Bed Mobility Overal bed mobility: Needs Assistance Bed Mobility: Supine to Sit Supine to sit: Supervision, HOB elevated General bed mobility comments: SBA for safety Transfers Overall transfer level: Needs assistance Equipment used: Rolling walker (2 wheeled) Transfers: Sit to/from Stand Sit to Stand: Min assist General transfer comment: vc's required for hand and foot placement and transfer technique/walker use (pt required tactile cues for L hand placement on RW) Ambulation/Gait Ambulation/Gait assistance: Min assist, +2 safety/equipment (chair follow) Ambulation Distance (Feet): 60 Feet Assistive device: Rolling walker (2 wheeled) General Gait Details: increased L lateral sway and towards end of ambulation loss of balance to R requiring assist to maintain balance; decreased L push-off and pt catching L forefoot on ground consistently each step (just prior to swing phase) with decreased L hip flexion AROM noted during swing  phase Gait velocity: mildly decreased    ADL: ADL Overall ADL's : Needs assistance/impaired Eating/Feeding: Set up,  Minimal assistance Grooming: Minimal assistance, Set up Lower Body Dressing: Moderate assistance General ADL Comments: Pt. presents with difficulty performing ADL tasks that require bilateral hand use, and has difficulty incorporating his LUE and hand into the ADL tasks.  Cognition: Cognition Overall Cognitive Status: Within Functional Limits for tasks assessed Orientation Level: Oriented X4 Cognition Arousal/Alertness: Awake/alert Behavior During Therapy: WFL for tasks assessed/performed Overall Cognitive Status: Within Functional Limits for tasks assessed  Physical Exam: Blood pressure 126/95, pulse 79, temperature 97.6 F (36.4 C), temperature source Oral, resp. rate 18, height 5\' 6"  (1.676 m), weight 82.464 kg (181 lb 12.8 oz), SpO2 97 %. Physical Exam  Vitals reviewed. Constitutional: He appears well-developed and well-nourished.  HENT:  Head: Normocephalic and atraumatic.  Eyes: Conjunctivae and EOM are normal.  Neck: Normal range of motion. Neck supple. No thyromegaly present.  Cardiovascular: Normal rate and regular rhythm.   Respiratory: Effort normal and breath sounds normal. No respiratory distress.  GI: Soft. Bowel sounds are normal. He exhibits no distension.  Musculoskeletal: Normal range of motion. He exhibits no edema or tenderness.  Neurological: He is alert.  Patient very limited English speaking.  He does follow demonstrated commands.  Sensation diminished light touch left upper and left lower extremity  DTRs symmetric  Motor: Right upper extremity/right lower extremity: 5/5 proximal to distal Left upper extremity/left lower extremity 4+/5 proximal to distal. Decreased FMC left upper extremity  Skin: Skin is warm and dry.  Psychiatric: He has a normal mood and affect. His behavior is normal.    Results for orders placed or performed  during the hospital encounter of 04/13/16 (from the past 48 hour(s))  Hemoglobin A1c     Status: None   Collection Time: 04/14/16  5:35 AM  Result Value Ref Range   Hgb A1c MFr Bld 5.7 4.0 - 6.0 %  Lipid panel     Status: Abnormal   Collection Time: 04/14/16  5:35 AM  Result Value Ref Range   Cholesterol 165 0 - 200 mg/dL   Triglycerides 725 (H) <150 mg/dL   HDL 26 (L) >40 mg/dL   Total CHOL/HDL Ratio 6.3 RATIO   VLDL UNABLE TO CALCULATE IF TRIGLYCERIDE OVER 400 mg/dL 0 - 40 mg/dL   LDL Cholesterol UNABLE TO CALCULATE IF TRIGLYCERIDE OVER 400 mg/dL 0 - 99 mg/dL    Comment:        Total Cholesterol/HDL:CHD Risk Coronary Heart Disease Risk Table                     Men   Women  1/2 Average Risk   3.4   3.3  Average Risk       5.0   4.4  2 X Average Risk   9.6   7.1  3 X Average Risk  23.4   11.0        Use the calculated Patient Ratio above and the CHD Risk Table to determine the patient's CHD Risk.        ATP III CLASSIFICATION (LDL):  <100     mg/dL   Optimal  100-129  mg/dL   Near or Above                    Optimal  130-159  mg/dL   Borderline  160-189  mg/dL   High  >190     mg/dL   Very High    Mr [facility] Wo Contrast  04/14/2016  CLINICAL DATA:  Stroke.  Left-sided weakness. EXAM: MRA HEAD WITHOUT CONTRAST TECHNIQUE: Angiographic images of the Circle of Willis were obtained using MRA technique without intravenous contrast. COMPARISON:  MRI head 04/13/2016 FINDINGS: Severe diffuse stenosis distal right vertebral artery with more normal caliber at the C1 level. This is likely due to atherosclerotic disease in the distal right vertebral artery. Left vertebral artery patent. Left PICA patent. Right PICA not visualized. Basilar widely patent. Occlusion of the proximal right posterior cerebral artery accounting for the acute infarct. Moderate stenosis in the mid left posterior cerebral artery. Superior cerebellar arteries are patent but small Internal carotid artery patent  bilaterally. Mild stenosis left cavernous carotid. Hypoplastic right A1 segment. Both anterior cerebral arteries are patent and supplied from the left. Middle cerebral arteries patent bilaterally Negative for cerebral aneurysm. 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 Severe stenosis distal right vertebral artery. This is probably due to atherosclerotic disease and could be a source of embolus to the right posterior cerebral artery. Electronically Signed   By: Franchot Gallo M.D.   On: 04/14/2016 11:06   Mr Brain Wo Contrast  04/13/2016  CLINICAL DATA:  60 year old male with left side weakness (arm greater than leg) since last known to be normal on 04/11/2016. Dizziness. Initial encounter. EXAM: MRI HEAD WITHOUT CONTRAST TECHNIQUE: Multiplanar, multiecho pulse sequences of the brain and surrounding structures were obtained without intravenous contrast. COMPARISON:  Head CT 0428 hours today. FINDINGS: 15 mm curvilinear segment of restricted diffusion in the right thalamus tracking toward the posterior limb of the right internal capsule. Superimposed diffusion restriction throughout the right hippocampal formation (series 9, image 17). Minimal associated T2 and FLAIR hyperintensity. No associated hemorrhage or mass effect. Major intracranial vascular flow voids are preserved. No other parenchymal restricted diffusion. No signal abnormality elsewhere in the right temporal lobe. The right insula is normal. There is a superimposed 2 cm left operculum dural based lesion (series 12, image 15 and series 15, image 16) which was occult on the earlier CT and is most compatible with meningioma. There is mild regional mass effect on the left temporal and frontal operculum, but no associated cerebral edema. Evidence of a broad-based dural attachment on series 12. No contrast administered. No other intracranial mass  lesion or mass effect. No ventriculomegaly or acute intracranial hemorrhage. Cervicomedullary junction and pituitary are within normal limits. Outside of the acute findings above, gray and white matter signal is within normal limits for age. No cortical encephalomalacia or chronic cerebral blood products identified. Visible internal auditory structures appear normal. Mastoids are clear. Trace paranasal sinus mucosal thickening. Negative orbit and scalp soft tissues. Visualized bone marrow signal is within normal limits. IMPRESSION: 1. Diffusion abnormality in the right thalamus and throughout the right hippocampal formation, favor this is all post ischemic such as from right PCA territory small vessel disease. No associated hemorrhage or mass effect. 2. Superimposed 2 cm left lateral convexity probable meningioma at the level of the left operculum. Mild regional mass effect with no cerebral edema. Burtis Junes this is clinically silent. Electronically Signed   By: Genevie Ann M.D.   On: 04/13/2016 14:21   US Carotid Bilateral  04/14/2016  CLINICAL DATA:  TIA.  Blurred vision.  Previous tobacco abuse. EXAM: BILATERAL CAROTID DUPLEX ULTRASOUND TECHNIQUE: Pearline Cables scale imaging, color Doppler and duplex ultrasound was performed of bilateral carotid and vertebral arteries in the neck. COMPARISON:  None. REVIEW OF SYSTEMS: Quantification of carotid  stenosis is based on velocity parameters that correlate the residual internal carotid diameter with NASCET-based stenosis levels, using the diameter of the distal internal carotid lumen as the denominator for stenosis measurement. The following velocity measurements were obtained: PEAK SYSTOLIC/END DIASTOLIC RIGHT ICA:                     89/26cm/sec CCA:                     [id number] SYSTOLIC ICA/CCA RATIO:  0.9 DIASTOLIC ICA/CCA RATIO: 0.9 ECA:                     79cm/sec LEFT ICA:                     75/32cm/sec CCA:                     [id number] SYSTOLIC ICA/CCA RATIO:  0.9  DIASTOLIC ICA/CCA RATIO: 1.4 ECA:                     89cm/sec FINDINGS: RIGHT CAROTID ARTERY: No significant plaque or stenosis. Normal waveforms and color Doppler signal. RIGHT VERTEBRAL ARTERY:  Normal flow direction and waveform. LEFT CAROTID ARTERY: No significant plaque or stenosis. Normal waveforms and color Doppler signal. LEFT VERTEBRAL ARTERY: Normal flow direction and waveform. IMPRESSION: 1. No significant carotid plaque or stenosis. Electronically Signed   By: Lucrezia Europe M.D.   On: 04/14/2016 10:20   Medical Problem List and Plan: 1.  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  DVT Prophylaxis/Anticoagulation: Subcutaneous Lovenox. Monitor platelet counts of any signs of bleeding 3. Pain Management: Fioricet for headaches 4. Hypertension. Lopressor 25 mg twice a day 5. Neuropsych: This patient is capable of making decisions on his own behalf. 6. Skin/Wound Care: Routine skin checks 7. Fluids/Electrolytes/Nutrition: Routine I&O with follow-up chemistries 8. Hyperlipidemia. Lipitor 9. Tobacco abuse. Counseling  Post Admission Physician Evaluation: 1. 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 2. Patient is admitted to receive collaborative, interdisciplinary care between the physiatrist, rehab nursing staff, and therapy team. 3. Patient's level of medical complexity and substantial therapy needs in context of that medical necessity cannot be provided at a lesser intensity of care such as a SNF. 4. Patient has experienced substantial functional loss from his/her baseline which was documented above under the "Functional History" and "Functional Status" headings.  Judging by the patient's diagnosis, physical exam, and functional history, the patient has potential for functional progress which will result in measurable gains while on inpatient rehab.  These gains will be of substantial and  practical use upon discharge  in facilitating mobility and self-care at the household level. 5. Physiatrist will provide 24 hour management of medical needs as well as oversight of the therapy plan/treatment and provide guidance as appropriate regarding the interaction of the two. 6. 24 hour rehab nursing will assist with safety, disease management and patient education  and help integrate therapy concepts, techniques,education, etc. 7. PT will assess and treat for/with: Lower extremity strength, range of motion, stamina, balance, functional mobility, safety, adaptive techniques and equipment, coping skills, pain control, stroke education.   Goals are: Mod I. 8. OT will assess and treat for/with: ADL's, functional mobility, safety, upper extremity strength, adaptive techniques and equipment, ego support, and community reintegration.   Goals are: Mod I. Therapy may proceed  with showering this patient. 9. Case Management and Social Worker will assess and treat for psychological issues and discharge planning. 10. Team conference will be held weekly to assess progress toward goals and to determine barriers to discharge. 11. Patient will receive at least 3 hours of therapy per day at least 5 days per week. 12. ELOS: 6-9 days.       13. Prognosis:  good  Delice Lesch, MD 04/15/2016

## 2016-04-15 NOTE — Care Management (Signed)
[facility] started/Medical necessity appears to be ready. Unit clerk will create packet for [facility]. Gerald Stabs RN has attempted to call report. Patient to CIR today via [facility]. No further RNCM needs. Case closed.

## 2016-04-15 NOTE — PMR Pre-admission (Signed)
[place] PMR Admission Coordinator Pre-Admission Assessment  Patient: Victor Calhoun is an 60 y.o., male MRN: [id number] DOB: June 08, 1956 Height: 5\' 6"  (167.6 cm) Weight: 82.464 kg (181 lb 12.8 oz)  Insurance Information Self pay - no insurance  Emergency Contact Information Contact Information    Name Relation Home Work Mobile   Arroyo,Kelly Significant other [phone]        Current Medical History  Patient Admitting Diagnosis:  R thalamic infarct, R hippocampus infarct  History of Present Illness: A 60 year old right-handed non-English speaking male with no significant medical history except tobacco abuse. By chart review patient lives alone but does have a roommate currently working in [employer] [facility] and cannot assist. He does have a girlfriend that can assist but is awaiting a surgery. One level apartment with level entry. Patient independent prior to admission. Presented 04/13/2016 with left-sided weakness and dizziness after reportedly drinking several beers the night before. Initial cranial CT scan negative. MRI of the brain showed diffusion abnormality right thalamus and throughout right hippocampal formation. 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 Severe stenosis distal right vertebral artery. Patient did not receive TPA. Carotid Dopplers in no ICA stenosis. Echocardiogram with ejection fraction of 65% no wall motion abnormalities. Grade 1 diastolic dysfunction. Neurology consulted maintain on aspirin for CVA prophylaxis. Subcutaneous Lovenox for DVT prophylaxis. Currently maintained on a regular consistency diet. Lopressor has been added for blood pressure control. Reported bouts of headaches using Fioricet and monitor closely. Physical and occupational therapy evaluations completed 04/14/2016 with recommendations of  physical medicine rehabilitation consult.    Patient's medical record from [facility] has been reviewed by the rehabilitation admission coordinator and physician.  NIH Stroke scale: 2  Past Medical History  Past Medical History  Diagnosis Date  . Medical history non-contributory     Family History   family history is negative for Stroke.  Prior Rehab/Hospitalizations Has the patient had major surgery during 100 days prior to admission? No    Current Medications See MAR from [facility]  Patients Current Diet:   2 Gm Na diet, thin liquids  Precautions / Restrictions Precautions Precautions: Fall Restrictions Weight Bearing Restrictions: No   Has the patient had 2 or more falls or a fall with injury in the past year?No  Prior Activity Level Community (5-7x/wk): Worked FT in [employer], was driving, but license has expired  Prior Functional Level Self Care: Did the patient need help bathing, dressing, using the toilet or eating?  Independent  Indoor Mobility: Did the patient need assistance with walking from room to room (with or without device)? Independent  Stairs: Did the patient need assistance with internal or external stairs (with or without device)? Independent  Functional Cognition: Did the patient need help planning regular tasks such as shopping or remembering to take medications? Independent  Home Assistive Devices / Equipment Home Assistive Devices/Equipment: None Home Equipment: None  Prior Device Use: Indicate devices/aids used by the patient prior to current illness, exacerbation or injury? None   Prior Functional Level Current Functional Level  Bed Mobility  Independent  Other (Supervision to minguard with bed mobility)   Transfers  Independent  Min assist   Mobility - Walk/Wheelchair  Independent  Min assist (Ambulated 60 feet with RW)   Upper Body Dressing  Independent  Min assist   Lower Body Dressing  Independent  Mod assist   Grooming  Independent  Min assist   Eating/Drinking  Independent  Min assist   Toilet Transfer  Independent  Min assist   Bladder Continence   WDL  Voiding in bathroom with assistance   Bowel Management  WDL  Last BM today per patient 04/15/16   Stair Climbing     Other (Not tried)   Communication  Speaks Spanish  Speaks Spanish, used an interpreter   Memory  Intact  Intact (Answered all questions appropriately.)   Cooking/Meal Prep  Independent      Housework  Independent    Money Management  Independent    Driving         Special needs/care consideration BiPAP/CPAP No CPM No Continuous Drip IV No Dialysis No        Life Vest No Oxygen No Special Bed No Trach Size No Wound Vac (area) No      Skin:  Has patches of skin discoloration with itching                              Bowel mgmt: Last BM 04/15/16  Bladder mgmt: Voiding in bathroom with assistance Diabetic mgmt:  Yes, per patient he is a new diabetic this admission.  Previous Home Environment Living Arrangements: Alone, Non-relatives/Friends (Pt lives with roommate but roommate currently working in [facility] and not around to assist) Available Help at Discharge:  (Pt's girlfriend able to assist until next Thursday when she is going to have surgery) Type of Home: Apartment Home Layout: One level Home Access: Level entry Bathroom Shower/Tub: Tub/shower unit, Architectural technologist: [place]: No  Discharge Living Setting Plans for Discharge Living Setting: Lives with (comment), Apartment (Lives with a roommate, but roommate out of town.) Type of Home at Discharge: Apartment Discharge Home Layout: One level Discharge Home Access: Level entry Does the patient have any problems obtaining your medications?: No  Social/Family/Support Systems Patient Roles: Parent, Other (Comment) (Has a girlfriend, 4 daughters, 4 sons.) Contact Information: Jeannine Boga  - girlfriend - [phone] Anticipated Caregiver: self, possibly a niece, possibly his girlfriend Ability/Limitations of Caregiver: Patient to talk to his niece.  Girlfriend due to have surgery soon. Caregiver Availability: Other (Comment) (Patient working on getting some supervision at discharge.) Discharge Plan Discussed with Primary Caregiver: No (Patient to speak with his niece and girlfriend.) Is Caregiver In Agreement with Plan?: Yes Does Caregiver/Family have Issues with Lodging/Transportation while Pt is in Rehab?: No  Goals/Additional Needs Patient/Family Goal for Rehab: PT/OT mod I and supervision goals Expected length of stay: 7 days Cultural Considerations: [employer]ec[employer], from [place] 16 yrs ago, speaks Spanish Dietary Needs: 2 Gm Na, thin liquids Equipment Needs: TBD Additional Information: Patient has had 4 wives and a current girlfriend.  He has 8 children.  Family live in [place] and [place] Pt/Family Agrees to Admission and willing to participate: Yes Program Orientation Provided & Reviewed with Pt/Caregiver Including Roles  & Responsibilities: Yes  Patient Condition: Patient was seen an evaluated at [facility]  He is suffered a Right CVA with left arm weakness and impaired gait.  He is participating with PT and OT.  He can benefit from the coordinated approach of an inpatient rehab admission and he can tolerate 3 hours of therapy a day.  I have reviewed all findings with rehab MD and have received approval for acute inpatient rehab admission for today.  Preadmission Screen Completed By:  Retta Diones, 04/15/2016 12:58 PM ______________________________________________________________________   Discussed status with Dr. Posey Pronto on 04/15/16 at 30 and received telephone approval for admission today.  Admission Coordinator:  Retta Diones, time 1310/Date 04/15/16   Assessment/Plan: Diagnosis: R thalamic infarct, R hippocampus infarct 1. Does the need for close, 24 hr/day   Medical supervision in concert with the patient's rehab needs make it unreasonable for this patient to be served in a less intensive setting? Yes 2. Co-Morbidities requiring supervision/potential complications: HTN (monitor and provide prns in accordance with increased physical exertion and pain) 3. Due to safety, disease management and patient education, does the patient require 24 hr/day rehab nursing? Yes 4. Does the patient require coordinated care of a physician, rehab nurse, PT (1-2 hrs/day, 5 days/week) and OT (1-2 hrs/day, 5 days/week) to address physical and functional deficits in the context of the above medical diagnosis(es)? Potentially Addressing deficits in the following areas: balance, endurance, locomotion, strength, transferring, toileting and psychosocial support 5. Can the patient actively participate in an intensive therapy program of at least 3 hrs of therapy 5 days a week? Yes 6. The potential for patient to make measurable gains while on inpatient rehab is good 7. Anticipated functional outcomes upon discharge from inpatients are: modified independent PT, modified independent OT, n/a SLP 8. Estimated rehab length of stay to reach the above functional goals is: 8-12 days. 9. Does the patient have adequate social supports to accommodate these discharge functional goals? Yes 10. Anticipated D/C setting: Home 11. Anticipated post D/C treatments: HH therapy and Home excercise program 12. Overall Rehab/Functional Prognosis: good    RECOMMENDATIONS: This patient's condition is appropriate for continued rehabilitative care in the following setting: CIR Patient has agreed to participate in recommended program. Yes Note that insurance prior authorization may be required for reimbursement for recommended care.  Comment: Rehab Admissions Coordinator to follow up.  Retta Diones 04/15/2016  Delice Lesch, MD 04/15/16

## 2016-04-15 NOTE — Progress Notes (Signed)
[place] admissions - Evaluated for possible admission.  I met with patient at the bedside with an interpreter present.  Patient would like to come to inpatient rehab here at [facility].  I have a bed available today and will plan admit for today to inpatient rehab.  Case manager is aware and we have medical attending MD clearance.  Call me for questions.  #[phone]

## 2016-04-15 NOTE — Discharge Summary (Signed)
[facility] [place] NAME: Victor Calhoun    MR#:  [id number]  DATE OF BIRTH:  10/19/1956  DATE OF ADMISSION:  04/13/2016 ADMITTING PHYSICIAN: Lytle Butte, MD  DATE OF DISCHARGE:04/15/2016  PRIMARY CARE PHYSICIAN: No primary care provider on file.    ADMISSION DIAGNOSIS:  Left arm numbness [R20.8] Left arm weakness [R29.898]  DISCHARGE DIAGNOSIS:  Active Problems:   TIA (transient ischemic attack)   Acute CVA (cerebrovascular accident) ([place])   SECONDARY DIAGNOSIS:   Past Medical History  Diagnosis Date  . Medical history non-contributory     HOSPITAL COURSE:   * Acute stroke  MRA confirmed.  TG and LDL high.  BP high  Started on statin and metoprolol for better control.  ASA.  PT and OT eval.- suggested acute inpatient rehab.  Carotid doppler does nto show any source of stroke, Echo done.   Appreciated neurology consult.  * Uncontrolled Htn  Started on metoprolol.  * Hyperlipidemia  Added atorvastatin    DISCHARGE CONDITIONS:   Stable.  CONSULTS OBTAINED:  Treatment Team:  Lytle Butte, MD Alexis Goodell, MD  DRUG ALLERGIES:  No Known Allergies  DISCHARGE MEDICATIONS:   Current Discharge Medication List    START taking these medications   Details  aspirin 325 MG tablet Take 1 tablet (325 mg total) by mouth daily. Qty: 30 tablet, Refills: 1    atorvastatin (LIPITOR) 40 MG tablet Take 1 tablet (40 mg total) by mouth daily at 6 PM. Qty: 30 tablet, Refills: 1    butalbital-acetaminophen-caffeine (FIORICET, ESGIC) 50-325-40 MG tablet Take 1 tablet by mouth every 6 (six) hours as needed for headache. Qty: 14 tablet, Refills: 0    metoprolol tartrate (LOPRESSOR) 25 MG tablet Take 1 tablet (25 mg total) by mouth 2 (two) times daily. Qty: 60 tablet, Refills: 1         DISCHARGE INSTRUCTIONS:    Follow with neurologist in 2 weeks.  If you experience worsening of your  admission symptoms, develop shortness of breath, life threatening emergency, suicidal or homicidal thoughts you must seek medical attention immediately by calling 911 or calling your MD immediately  if symptoms less severe.  You Must read complete instructions/literature along with all the possible adverse reactions/side effects for all the Medicines you take and that have been prescribed to you. Take any new Medicines after you have completely understood and accept all the possible adverse reactions/side effects.   Please note  You were cared for by a hospitalist during your hospital stay. If you have any questions about your discharge medications or the care you received while you were in the hospital after you are discharged, you can call the unit and asked to speak with the hospitalist on call if the hospitalist that took care of you is not available. Once you are discharged, your primary care physician will handle any further medical issues. Please note that NO REFILLS for any discharge medications will be authorized once you are discharged, as it is imperative that you return to your primary care physician (or establish a relationship with a primary care physician if you do not have one) for your aftercare needs so that they can reassess your need for medications and monitor your lab values.    Today   CHIEF COMPLAINT:   Chief Complaint  Patient presents with  . Weakness  . Dizziness    HISTORY OF PRESENT ILLNESS:  Victor Calhoun  is  a 60 y.o. male without significant medical history presenting with left-sided weakness. Last known normal [id number] uncertain about time but prior to bed. Patient awoke 04/12/2016 after drinking several beers the night before with dizziness left-sided weakness. This includes arm and leg upper extremity more prominent than lower. Symptoms persisted but somewhat improved though not yet back to baseline. Denies further symptoms at this time other than  weakness.   VITAL SIGNS:  Blood pressure 126/95, pulse 79, temperature 97.6 F (36.4 C), temperature source Oral, resp. rate 18, height 5\' 6"  (1.676 m), weight 82.464 kg (181 lb 12.8 oz), SpO2 97 %.  I/O:   Intake/Output Summary (Last 24 hours) at 04/15/16 1132 Last data filed at 04/14/16 1700  Gross per 24 hour  Intake    360 ml  Output      0 ml  Net    360 ml    PHYSICAL EXAMINATION:   GENERAL: 60 y.o.-year-old patient lying in the bed with no acute distress.  EYES: Pupils equal, round, reactive to light and accommodation. No scleral icterus. Extraocular muscles intact.  HEENT: Head atraumatic, normocephalic. Oropharynx and nasopharynx clear.  NECK: Supple, no jugular venous distention. No thyroid enlargement, no tenderness.  LUNGS: Normal breath sounds bilaterally, no wheezing, rales,rhonchi or crepitation. No use of accessory muscles of respiration.  CARDIOVASCULAR: S1, S2 normal. No murmurs, rubs, or gallops.  ABDOMEN: Soft, nontender, nondistended. Bowel sounds present. No organomegaly or mass.  EXTREMITIES: No pedal edema, cyanosis, or clubbing.  NEUROLOGIC: Cranial nerves II through XII are intact. Muscle strength 5/5 in right side extremities. On left side 3/5 in upper limb and 4/5 in lower limb. Sensation intact. Gait not checked.  PSYCHIATRIC: The patient is alert and oriented x 3.  SKIN: No obvious rash, lesion, or ulcer.    DATA REVIEW:   CBC  Recent Labs Lab 04/13/16 0417  WBC 7.1  HGB 15.6  HCT 45.3  PLT 181    Chemistries   Recent Labs Lab 04/13/16 0417  NA 138  K 3.7  CL 108  CO2 24  GLUCOSE 120*  BUN 20  CREATININE 1.18  CALCIUM 8.9  AST 23  ALT 21  ALKPHOS 69  BILITOT 0.9    Cardiac Enzymes  Recent Labs Lab 04/13/16 0725  TROPONINI <0.03    Microbiology Results  No results found for this or any previous visit.  RADIOLOGY:  Mr Virgel Paling Wo Contrast  04/14/2016  CLINICAL DATA:  Stroke.  Left-sided weakness. EXAM:  MRA HEAD WITHOUT CONTRAST TECHNIQUE: Angiographic images of the Circle of Willis were obtained using MRA technique without intravenous contrast. COMPARISON:  MRI head 04/13/2016 FINDINGS: Severe diffuse stenosis distal right vertebral artery with more normal caliber at the C1 level. This is likely due to atherosclerotic disease in the distal right vertebral artery. Left vertebral artery patent. Left PICA patent. Right PICA not visualized. Basilar widely patent. Occlusion of the proximal right posterior cerebral artery accounting for the acute infarct. Moderate stenosis in the mid left posterior cerebral artery. Superior cerebellar arteries are patent but small Internal carotid artery patent bilaterally. Mild stenosis left cavernous carotid. Hypoplastic right A1 segment. Both anterior cerebral arteries are patent and supplied from the left. Middle cerebral arteries patent bilaterally Negative for cerebral aneurysm. 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 Severe stenosis distal right vertebral artery. This is probably due to atherosclerotic disease and could be a source of embolus  to the right posterior cerebral artery. Electronically Signed   By: Franchot Gallo M.D.   On: 04/14/2016 11:06   Mr Brain Wo Contrast  04/13/2016  CLINICAL DATA:  60 year old male with left side weakness (arm greater than leg) since last known to be normal on 04/11/2016. Dizziness. Initial encounter. EXAM: MRI HEAD WITHOUT CONTRAST TECHNIQUE: Multiplanar, multiecho pulse sequences of the brain and surrounding structures were obtained without intravenous contrast. COMPARISON:  Head CT 0428 hours today. FINDINGS: 15 mm curvilinear segment of restricted diffusion in the right thalamus tracking toward the posterior limb of the right internal capsule. Superimposed diffusion restriction throughout the right hippocampal formation (series  9, image 17). Minimal associated T2 and FLAIR hyperintensity. No associated hemorrhage or mass effect. Major intracranial vascular flow voids are preserved. No other parenchymal restricted diffusion. No signal abnormality elsewhere in the right temporal lobe. The right insula is normal. There is a superimposed 2 cm left operculum dural based lesion (series 12, image 15 and series 15, image 16) which was occult on the earlier CT and is most compatible with meningioma. There is mild regional mass effect on the left temporal and frontal operculum, but no associated cerebral edema. Evidence of a broad-based dural attachment on series 12. No contrast administered. No other intracranial mass lesion or mass effect. No ventriculomegaly or acute intracranial hemorrhage. Cervicomedullary junction and pituitary are within normal limits. Outside of the acute findings above, gray and white matter signal is within normal limits for age. No cortical encephalomalacia or chronic cerebral blood products identified. Visible internal auditory structures appear normal. Mastoids are clear. Trace paranasal sinus mucosal thickening. Negative orbit and scalp soft tissues. Visualized bone marrow signal is within normal limits. IMPRESSION: 1. Diffusion abnormality in the right thalamus and throughout the right hippocampal formation, favor this is all post ischemic such as from right PCA territory small vessel disease. No associated hemorrhage or mass effect. 2. Superimposed 2 cm left lateral convexity probable meningioma at the level of the left operculum. Mild regional mass effect with no cerebral edema. Burtis Junes this is clinically silent. Electronically Signed   By: Genevie Ann M.D.   On: 04/13/2016 14:21   US Carotid Bilateral  04/14/2016  CLINICAL DATA:  TIA.  Blurred vision.  Previous tobacco abuse. EXAM: BILATERAL CAROTID DUPLEX ULTRASOUND TECHNIQUE: Pearline Cables scale imaging, color Doppler and duplex ultrasound was performed of bilateral carotid  and vertebral arteries in the neck. COMPARISON:  None. REVIEW OF SYSTEMS: Quantification of carotid stenosis is based on velocity parameters that correlate the residual internal carotid diameter with NASCET-based stenosis levels, using the diameter of the distal internal carotid lumen as the denominator for stenosis measurement. The following velocity measurements were obtained: PEAK SYSTOLIC/END DIASTOLIC RIGHT ICA:                     89/26cm/sec CCA:                     [id number] SYSTOLIC ICA/CCA RATIO:  0.9 DIASTOLIC ICA/CCA RATIO: 0.9 ECA:                     79cm/sec LEFT ICA:                     75/32cm/sec CCA:                     [id number] SYSTOLIC ICA/CCA RATIO:  0.9 DIASTOLIC ICA/CCA RATIO: 1.4 ECA:  89cm/sec FINDINGS: RIGHT CAROTID ARTERY: No significant plaque or stenosis. Normal waveforms and color Doppler signal. RIGHT VERTEBRAL ARTERY:  Normal flow direction and waveform. LEFT CAROTID ARTERY: No significant plaque or stenosis. Normal waveforms and color Doppler signal. LEFT VERTEBRAL ARTERY: Normal flow direction and waveform. IMPRESSION: 1. No significant carotid plaque or stenosis. Electronically Signed   By: Lucrezia Europe M.D.   On: 04/14/2016 10:20     Management plans discussed with the patient, family and they are in agreement.  CODE STATUS: full.    Code Status Orders        Start     Ordered   04/13/16 0810  Full code   Continuous     04/13/16 0810    Code Status History    Date Active Date Inactive Code Status Order ID Comments User Context   This patient has a current code status but no historical code status.      TOTAL TIME TAKING CARE OF THIS PATIENT: 35 minutes.    Vaughan Basta M.D on 04/15/2016 at 11:32 AM  Between 7am to 6pm - Pager - [phone]  After 6pm go to [email or web address] - password [email or web address] [facility]  Office  [phone]  CC: Primary care physician; No primary care provider on  file.   Note: This dictation was prepared with Dragon dictation along with smaller phrase technology. Any transcriptional errors that result from this process are unintentional.

## 2016-04-15 NOTE — Plan of Care (Signed)
Problem: Food- and Nutrition-Related Knowledge Deficit (NB-1.1) Goal: Nutrition education Formal process to instruct or train a patient/client in a skill or to impart knowledge to help patients/clients voluntarily manage or modify food choices and eating behavior to maintain or improve health. Outcome: Completed/Met Date Met:  04/15/16   Nutrition Education Note  RD consulted for nutrition education regarding a Stroke Nutrition Therapy.   Lipid Panel     Component Value Date/Time    CHOL 165 04/14/2016 0535    TRIG 725* 04/14/2016 0535    HDL 26* 04/14/2016 0535    CHOLHDL 6.3 04/14/2016 0535    VLDL UNABLE TO CALCULATE IF TRIGLYCERIDE OVER 400 mg/dL 04/14/2016 0535    LDLCALC UNABLE TO CALCULATE IF TRIGLYCERIDE OVER 400 mg/dL 04/14/2016 0535    RD provided "Stroke Nutrition Therapy" handout from the [employer] in [place] and provided education with spanish interpretor present. Reviewed patient's dietary recall. Provided examples on ways to decrease sodium and fat intake in diet. Discouraged intake of processed foods and use of salt shaker. Encouraged fresh fruits and vegetables as well as whole grain sources of carbohydrates to maximize fiber intake. Teach back method used.  Expect good compliance.  Body mass index is 29.36 kg/(m^2).   Current diet order is Low Sodium, patient is consuming approximately 80-100% of meals at this time. Labs and medications reviewed. No further nutrition interventions warranted at this time. RD contact information provided. If additional nutrition issues arise, please re-consult RD.  Dwyane Luo, RD, LDN Pager [phone] Weekend/On-Call Pager [phone]

## 2016-04-15 NOTE — Progress Notes (Signed)
Called MD patient c/o headache like he had prior to stoke and increased numbness on left side, per MD will put in new pain med order and he will see patient

## 2016-04-15 NOTE — Interval H&P Note (Signed)
Victor Calhoun was admitted today to Inpatient Rehabilitation with the diagnosis of right thalamus and right hyppocampal -formation CVA.  The patient's history has been reviewed, patient examined, and there is no change in status.  Patient continues to be appropriate for intensive inpatient rehabilitation.  I have reviewed the patient's chart and labs.  Questions were answered to the patient's satisfaction. The PAPE has been reviewed and assessment remains appropriate.  Victor Calhoun Victor Calhoun 04/15/2016, 5:18 PM

## 2016-04-15 NOTE — Progress Notes (Signed)
Report called to Pleas Koch RN at [facility], patient transferred via [facility] transport to [facility] .

## 2016-04-15 NOTE — Care Management (Signed)
Admitted to [facility] with the diagnosis of TIA. Lives with 60 year old room mate that works a full time job. Girlfriend is Victor Calhoun [phone]). Lived in [place] x 12 years. No insurance. No physician. Never been to th [facility]. No home Health in the past. No skilled facility in the past. No medical equipment in the home. Takes care of all basic and instrumental activities of daily living himself, drives. States his girlfriend can help out in the home, if needed. No other family or friends can help.  Physical and occupation therapy evaluations completed. Recommending Inpatient Acute Rehabilitation. Spoke with Mr Victor Calhoun. States that he is in agreement to go to Inpatient Acute Rehabilitation, if needed.  All the above conversation was through [place] interpreter. Will ask Mamie Laurel, RN representative for Inpatient acute rehabilitation to evaluate. Shelbie Ammons RN MSN CCM Care Management [phone]

## 2016-04-15 NOTE — Consult Note (Signed)
Referring Physician: Anselm Jungling    Chief Complaint: Left sided numbness and weakness  HPI: Victor Calhoun is an 60 y.o. male who was in his usual state of health until Sunday evening.  While driving had the onset of left sided numbness and weakness.  With no improvement in his symptoms patient presented for evaluation on the 17th.  Initial NIHSS of 2  Date last known well: 04/12/2016 Time last known well: Time: 19:00 tPA Given: No: Outside time window  MRankin: 0  Past Medical History  Diagnosis Date  . Medical history non-contributory     Patient does not see a physcian  Past Surgical History  Procedure Laterality Date  . No past surgeries      Family History  Problem Relation Age of Onset  . Stroke Neg Hx     Patient reports that all of his brothers and his father has had a stroke.    Social History:  reports that he has been smoking.  He does not have any smokeless tobacco history on file. He reports that he drinks alcohol. He reports that he does not use illicit drugs.  Allergies: No Known Allergies  Medications:  I have reviewed the patient's current medications. Prior to Admission:  No prescriptions prior to admission   Scheduled: . aspirin  300 mg Rectal Daily   Or  . aspirin  325 mg Oral Daily  . atorvastatin  40 mg Oral q1800  . enoxaparin (LOVENOX) injection  40 mg Subcutaneous Q24H  . metoprolol tartrate  25 mg Oral BID  . sodium chloride flush  3 mL Intravenous Q12H    ROS: History obtained from the patient  General ROS: negative for - chills, fatigue, fever, night sweats, weight gain or weight loss Psychological ROS: negative for - behavioral disorder, hallucinations, memory difficulties, mood swings or suicidal ideation Ophthalmic ROS: negative for - blurry vision, double vision, eye pain or loss of vision ENT ROS: negative for - epistaxis, nasal discharge, oral lesions, sore throat, tinnitus or vertigo Allergy and Immunology ROS: negative for -  hives or itchy/watery eyes Hematological and Lymphatic ROS: negative for - bleeding problems, bruising or swollen lymph nodes Endocrine ROS: negative for - galactorrhea, hair pattern changes, polydipsia/polyuria or temperature intolerance Respiratory ROS: negative for - cough, hemoptysis, shortness of breath or wheezing Cardiovascular ROS: negative for - chest pain, dyspnea on exertion, edema or irregular heartbeat Gastrointestinal ROS: negative for - abdominal pain, diarrhea, hematemesis, nausea/vomiting or stool incontinence Genito-Urinary ROS: negative for - dysuria, hematuria, incontinence or urinary frequency/urgency Musculoskeletal ROS: negative for - joint swelling or muscular weakness Neurological ROS: as noted in HPI, headache Dermatological ROS: negative for rash and skin lesion changes  Physical Examination: Blood pressure 126/95, pulse 79, temperature 97.6 F (36.4 C), temperature source Oral, resp. rate 18, height 5\' 6"  (1.676 m), weight 82.464 kg (181 lb 12.8 oz), SpO2 97 %.  HEENT-  Normocephalic, no lesions, without obvious abnormality.  Normal external eye and conjunctiva.  Normal TM's bilaterally.  Normal auditory canals and external ears. Normal external nose, mucus membranes and septum.  Normal pharynx. Cardiovascular- S1, S2 normal, pulses palpable throughout   Lungs- chest clear, no wheezing, rales, normal symmetric air entry Abdomen- soft, non-tender; bowel sounds normal; no masses,  no organomegaly Extremities- no edema Lymph-no adenopathy palpable Musculoskeletal-no joint tenderness, deformity or swelling Skin-warm and dry, no hyperpigmentation, vitiligo, or suspicious lesions  Neurological Examination Mental Status: Alert, oriented, thought content appropriate.  Speech fluent without evidence of aphasia.  Able to follow 3 step commands without difficulty. Cranial Nerves: II: Discs flat bilaterally; Visual fields grossly normal, pupils equal, round, reactive to  light and accommodation III,IV, VI: ptosis not present, extra-ocular motions intact bilaterally V,VII: smile symmetric, facial light touch sensation decreased on the left VIII: hearing normal bilaterally IX,X: gag reflex present XI: bilateral shoulder shrug XII: midline tongue extension Motor: Right : Upper extremity   5/5    Left:     Upper extremity   4+/5 with pronator drift  Lower extremity   5/5     Lower extremity   5-/5 Tone and bulk:normal tone throughout; no atrophy noted Sensory: Pinprick and light touch decreased on the left Deep Tendon Reflexes: 2+ and symmetric throughout Plantars: Right: downgoing   Left: downgoing Cerebellar: Dysmetria with finger-to-nose testing on the left, decreased rapid alternating movements in the right hand and normal heel-to-shin testing bilaterally Gait: not tested due to safety concerns   Laboratory Studies:  Basic Metabolic Panel:  Recent Labs Lab 04/13/16 0417  NA 138  K 3.7  CL 108  CO2 24  GLUCOSE 120*  BUN 20  CREATININE 1.18  CALCIUM 8.9    Liver Function Tests:  Recent Labs Lab 04/13/16 0417  AST 23  ALT 21  ALKPHOS 69  BILITOT 0.9  PROT 7.6  ALBUMIN 4.2   No results for input(s): LIPASE, AMYLASE in the last 168 hours. No results for input(s): AMMONIA in the last 168 hours.  CBC:  Recent Labs Lab 04/13/16 0417  WBC 7.1  NEUTROABS 4.3  HGB 15.6  HCT 45.3  MCV 90.5  PLT 181    Cardiac Enzymes:  Recent Labs Lab 04/13/16 0417 04/13/16 0725  TROPONINI <0.03 <0.03    BNP: Invalid input(s): POCBNP  CBG: No results for input(s): GLUCAP in the last 168 hours.  Microbiology: No results found for this or any previous visit.  Coagulation Studies:  Recent Labs  04/13/16 0417  LABPROT 13.0  INR 0.96    Urinalysis: No results for input(s): COLORURINE, LABSPEC, PHURINE, GLUCOSEU, HGBUR, BILIRUBINUR, KETONESUR, PROTEINUR, UROBILINOGEN, NITRITE, LEUKOCYTESUR in the last 168 hours.  Invalid  input(s): APPERANCEUR  Lipid Panel:    Component Value Date/Time   CHOL 165 04/14/2016 0535   TRIG 725* 04/14/2016 0535   HDL 26* 04/14/2016 0535   CHOLHDL 6.3 04/14/2016 0535   VLDL UNABLE TO CALCULATE IF TRIGLYCERIDE OVER 400 mg/dL 04/14/2016 0535   LDLCALC UNABLE TO CALCULATE IF TRIGLYCERIDE OVER 400 mg/dL 04/14/2016 0535    HgbA1C:  Lab Results  Component Value Date   HGBA1C 5.7 04/14/2016    Urine Drug Screen:  No results found for: LABOPIA, COCAINSCRNUR, LABBENZ, AMPHETMU, THCU, LABBARB  Alcohol Level:  Recent Labs Lab 04/13/16 [place] <5    Other results: EKG: sinus rhythm at 88 bpm.  Imaging: Mr Virgel Paling Wo Contrast  04/14/2016  CLINICAL DATA:  Stroke.  Left-sided weakness. EXAM: MRA HEAD WITHOUT CONTRAST TECHNIQUE: Angiographic images of the Circle of Willis were obtained using MRA technique without intravenous contrast. COMPARISON:  MRI head 04/13/2016 FINDINGS: Severe diffuse stenosis distal right vertebral artery with more normal caliber at the C1 level. This is likely due to atherosclerotic disease in the distal right vertebral artery. Left vertebral artery patent. Left PICA patent. Right PICA not visualized. Basilar widely patent. Occlusion of the proximal right posterior cerebral artery accounting for the acute infarct. Moderate stenosis in the mid left posterior cerebral artery. Superior cerebellar arteries are patent but small Internal  carotid artery patent bilaterally. Mild stenosis left cavernous carotid. Hypoplastic right A1 segment. Both anterior cerebral arteries are patent and supplied from the left. Middle cerebral arteries patent bilaterally Negative for cerebral aneurysm. 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 Severe stenosis distal right vertebral artery. This is probably due to atherosclerotic disease and could be a source of embolus to the right  posterior cerebral artery. Electronically Signed   By: Franchot Gallo M.D.   On: 04/14/2016 11:06   Mr Brain Wo Contrast  04/13/2016  CLINICAL DATA:  60 year old male with left side weakness (arm greater than leg) since last known to be normal on 04/11/2016. Dizziness. Initial encounter. EXAM: MRI HEAD WITHOUT CONTRAST TECHNIQUE: Multiplanar, multiecho pulse sequences of the brain and surrounding structures were obtained without intravenous contrast. COMPARISON:  Head CT 0428 hours today. FINDINGS: 15 mm curvilinear segment of restricted diffusion in the right thalamus tracking toward the posterior limb of the right internal capsule. Superimposed diffusion restriction throughout the right hippocampal formation (series 9, image 17). Minimal associated T2 and FLAIR hyperintensity. No associated hemorrhage or mass effect. Major intracranial vascular flow voids are preserved. No other parenchymal restricted diffusion. No signal abnormality elsewhere in the right temporal lobe. The right insula is normal. There is a superimposed 2 cm left operculum dural based lesion (series 12, image 15 and series 15, image 16) which was occult on the earlier CT and is most compatible with meningioma. There is mild regional mass effect on the left temporal and frontal operculum, but no associated cerebral edema. Evidence of a broad-based dural attachment on series 12. No contrast administered. No other intracranial mass lesion or mass effect. No ventriculomegaly or acute intracranial hemorrhage. Cervicomedullary junction and pituitary are within normal limits. Outside of the acute findings above, gray and white matter signal is within normal limits for age. No cortical encephalomalacia or chronic cerebral blood products identified. Visible internal auditory structures appear normal. Mastoids are clear. Trace paranasal sinus mucosal thickening. Negative orbit and scalp soft tissues. Visualized bone marrow signal is within normal  limits. IMPRESSION: 1. Diffusion abnormality in the right thalamus and throughout the right hippocampal formation, favor this is all post ischemic such as from right PCA territory small vessel disease. No associated hemorrhage or mass effect. 2. Superimposed 2 cm left lateral convexity probable meningioma at the level of the left operculum. Mild regional mass effect with no cerebral edema. Burtis Junes this is clinically silent. Electronically Signed   By: Genevie Ann M.D.   On: 04/13/2016 14:21   US Carotid Bilateral  04/14/2016  CLINICAL DATA:  TIA.  Blurred vision.  Previous tobacco abuse. EXAM: BILATERAL CAROTID DUPLEX ULTRASOUND TECHNIQUE: Pearline Cables scale imaging, color Doppler and duplex ultrasound was performed of bilateral carotid and vertebral arteries in the neck. COMPARISON:  None. REVIEW OF SYSTEMS: Quantification of carotid stenosis is based on velocity parameters that correlate the residual internal carotid diameter with NASCET-based stenosis levels, using the diameter of the distal internal carotid lumen as the denominator for stenosis measurement. The following velocity measurements were obtained: PEAK SYSTOLIC/END DIASTOLIC RIGHT ICA:                     89/26cm/sec CCA:                     [id number] SYSTOLIC ICA/CCA RATIO:  0.9 DIASTOLIC ICA/CCA RATIO: 0.9 ECA:  79cm/sec LEFT ICA:                     75/32cm/sec CCA:                     [id number] SYSTOLIC ICA/CCA RATIO:  0.9 DIASTOLIC ICA/CCA RATIO: 1.4 ECA:                     89cm/sec FINDINGS: RIGHT CAROTID ARTERY: No significant plaque or stenosis. Normal waveforms and color Doppler signal. RIGHT VERTEBRAL ARTERY:  Normal flow direction and waveform. LEFT CAROTID ARTERY: No significant plaque or stenosis. Normal waveforms and color Doppler signal. LEFT VERTEBRAL ARTERY: Normal flow direction and waveform. IMPRESSION: 1. No significant carotid plaque or stenosis. Electronically Signed   By: Lucrezia Europe M.D.   On: 04/14/2016 10:20     Assessment: 60 y.o. male presenting with left sided numbness and weakness.  MRI of the brain personally reviewed and shows an acute right thalamic/hippocampal infarct.  MRA shows right PCA occlusion and severe right vertebral stenosis.  Echocardiogram shows an EF of 60-65% with no cardiac source identified.  Carotid dopplers shows no hemodynamically signficant stenosis.  Patient on no antiplatelet therapy prior to admission.   Lipids significantly elevated.  A1c 5.7.    Stroke Risk Factors - family history, hyperlipidemia and smoking  Plan: 1. Agree with continued ASA 2. Telemetry monitoring 3. Frequent neuro checks 4. Continued therapy   Alexis Goodell, MD Neurology [phone] 04/15/2016, 10:44 AM

## 2016-04-15 NOTE — Progress Notes (Signed)
Victor Lorie Phenix, MD Physician Signed Physical Medicine and Rehabilitation PMR Pre-admission 04/15/2016 12:58 PM  Related encounter: ED to Hosp-Admission (Current) from 04/13/2016 in [place] (1C)    Expand All Collapse All     [employer] PMR Admission Coordinator Pre-Admission Assessment  Patient: Victor Calhoun is an 60 y.o., male MRN: [id number] DOB: 25-Aug-1956 Height: 5\' 6"  (167.6 cm) Weight: 82.464 kg (181 lb 12.8 oz)  Insurance Information Self pay - no insurance  Emergency Contact Information Contact Information    Name Relation Home Work Mobile   Arroyo,Kelly Significant other [phone]        Current Medical History  Patient Admitting Diagnosis: R thalamic infarct, R hippocampus infarct  History of Present Illness: A 60 year old right-handed non-English speaking male with no significant medical history except tobacco abuse. By chart review patient lives alone but does have a roommate currently working in [employer] and cannot assist. He does have a girlfriend that can assist but is awaiting a surgery. One level apartment with level entry. Patient independent prior to admission. Presented 04/13/2016 with left-sided weakness and dizziness after reportedly drinking several beers the night before. Initial cranial CT scan negative. MRI of the brain showed diffusion abnormality right thalamus and throughout right hippocampal formation. 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 Severe stenosis distal right vertebral artery. Patient did not receive TPA. Carotid Dopplers in no ICA stenosis. Echocardiogram with ejection fraction of 65% no wall motion abnormalities. Grade 1 diastolic dysfunction. Neurology consulted maintain on aspirin for CVA prophylaxis. Subcutaneous Lovenox for DVT  prophylaxis. Currently maintained on a regular consistency diet. Lopressor has been added for blood pressure control. Reported bouts of headaches using Fioricet and monitor closely. Physical and occupational therapy evaluations completed 04/14/2016 with recommendations of physical medicine rehabilitation consult.   Patient[facility] has been reviewed by the rehabilitation admission coordinator and physician.  NIH Stroke scale: 2  Past Medical History  Past Medical History  Diagnosis Date  . Medical history non-contributory     Family History  family history is negative for Stroke.  Prior Rehab/Hospitalizations Has the patient had major surgery during 100 days prior to admission? No   Current Medications See MAR from [facility]  Patients Current Diet: 2 Gm Na diet, thin liquids  Precautions / Restrictions Precautions Precautions: Fall Restrictions Weight Bearing Restrictions: No   Has the patient had 2 or more falls or a fall with injury in the past year?No  Prior Activity Level Community (5-7x/wk): Worked FT in [employer], was driving, but license has expired  Prior Functional Level Self Care: Did the patient need help bathing, dressing, using the toilet or eating? Independent  Indoor Mobility: Did the patient need assistance with walking from room to room (with or without device)? Independent  Stairs: Did the patient need assistance with internal or external stairs (with or without device)? Independent  Functional Cognition: Did the patient need help planning regular tasks such as shopping or remembering to take medications? Independent  Home Assistive Devices / Equipment Home Assistive Devices/Equipment: None Home Equipment: None  Prior Device Use: Indicate devices/aids used by the patient prior to current illness, exacerbation or injury? None   Prior Functional Level Current Functional Level  Bed  Mobility  Independent  Other (Supervision to minguard with bed mobility)   Transfers  Independent  Min assist   Mobility - Science writer  Min assist (Ambulated 60 feet with RW)   Upper Body Dressing  Independent  Min assist   Lower Body Dressing  Independent  Mod assist   Grooming  Independent  Min assist   Eating/Drinking  Independent  Min assist   Toilet Transfer  Independent  Min assist   Bladder Continence   WDL  Voiding in bathroom with assistance   Bowel Management  WDL  Last BM today per patient 04/15/16   Stair Climbing     Other (Not tried)   Communication  Speaks Spanish  Speaks Spanish, used an Therapist, sports  Intact  Intact (Answered all questions appropriately.)   Cooking/Meal Prep  Independent     Housework  Independent    Money Management  Independent    Driving         Special needs/care consideration BiPAP/CPAP No CPM No Continuous Drip IV No Dialysis No  Life Vest No Oxygen No Special Bed No Trach Size No Wound Vac (area) No  Skin: Has patches of skin discoloration with itching  Bowel mgmt: Last BM 04/15/16 Bladder mgmt: Voiding in bathroom with assistance Diabetic mgmt: Yes, per patient he is a new diabetic this admission.  Previous Home Environment Living Arrangements: Alone, Non-relatives/Friends (Pt lives with roommate but roommate currently working in [employer] and not around to assist) Available Help at Discharge: (Pt's girlfriend able to assist until next Thursday when she is going to have surgery) Type of Home: Apartment Home Layout: One level Home Access: Level entry Bathroom Shower/Tub: Tub/shower unit, Architectural technologist: [place]: No  Discharge Living Setting Plans for Discharge Living Setting: Lives with (comment), Apartment (Lives with a  roommate, but roommate out of town.) Type of Home at Discharge: Apartment Discharge Home Layout: One level Discharge Home Access: Level entry Does the patient have any problems obtaining your medications?: No  Social/Family/Support Systems Patient Roles: Parent, Other (Comment) (Has a girlfriend, 4 daughters, 4 sons.) Contact Information: Jeannine Boga - girlfriend - [phone] Anticipated Caregiver: self, possibly a niece, possibly his girlfriend Ability/Limitations of Caregiver: Patient to talk to his niece. Girlfriend due to have surgery soon. Caregiver Availability: Other (Comment) (Patient working on getting some supervision at discharge.) Discharge Plan Discussed with Primary Caregiver: No (Patient to speak with his niece and girlfriend.) Is Caregiver In Agreement with Plan?: Yes Does Caregiver/Family have Issues with Lodging/Transportation while Pt is in Rehab?: No  Goals/Additional Needs Patient/Family Goal for Rehab: PT/OT mod I and supervision goals Expected length of stay: 7 days Cultural Considerations: Pentec[employer]al, from [place] 16 yrs ago, speaks Spanish Dietary Needs: 2 Gm Na, thin liquids Equipment Needs: TBD Additional Information: Patient has had 4 wives and a current girlfriend. He has 8 children. Family live in [place] and [place] Pt/Family Agrees to Admission and willing to participate: Yes Program Orientation Provided & Reviewed with Pt/Caregiver Including Roles & Responsibilities: Yes  Patient Condition: Patient was seen an evaluated at [facility]. He is suffered a Right CVA with left arm weakness and impaired gait. He is participating with PT and OT. He can benefit from the coordinated approach of an inpatient rehab admission and he can tolerate 3 hours of therapy a day. I have reviewed all findings with rehab MD and have received approval for acute inpatient rehab admission for today.  Preadmission Screen Completed By: Retta Diones, 04/15/2016 12:58  PM ______________________________________________________________________  Discussed status with Dr. Posey Pronto on 04/15/16 at  1310 and received telephone approval for admission today.  Admission Coordinator: Retta Diones, time 1310/Date 04/15/16   Assessment/Plan: Diagnosis: R thalamic infarct, R hippocampus infarct 1. Does the need for close, 24 hr/day Medical supervision in concert with the patient's rehab needs make it unreasonable for this patient to be served in a less intensive setting? Yes 2. Co-Morbidities requiring supervision/potential complications: HTN (monitor and provide prns in accordance with increased physical exertion and pain) 3. Due to safety, disease management and patient education, does the patient require 24 hr/day rehab nursing? Yes 4. Does the patient require coordinated care of a physician, rehab nurse, PT (1-2 hrs/day, 5 days/week) and OT (1-2 hrs/day, 5 days/week) to address physical and functional deficits in the context of the above medical diagnosis(es)? Potentially Addressing deficits in the following areas: balance, endurance, locomotion, strength, transferring, toileting and psychosocial support 5. Can the patient actively participate in an intensive therapy program of at least 3 hrs of therapy 5 days a week? Yes 6. The potential for patient to make measurable gains while on inpatient rehab is good 7. Anticipated functional outcomes upon discharge from inpatients are: modified independent PT, modified independent OT, n/a SLP 8. Estimated rehab length of stay to reach the above functional goals is: 8-12 days. 9. Does the patient have adequate social supports to accommodate these discharge functional goals? Yes 10. Anticipated D/C setting: Home 11. Anticipated post D/C treatments: HH therapy and Home excercise program 12. Overall Rehab/Functional Prognosis: good    RECOMMENDATIONS: This patient's condition is appropriate for continued rehabilitative care  in the following setting: CIR Patient has agreed to participate in recommended program. Yes Note that insurance prior authorization may be required for reimbursement for recommended care.  Comment: Rehab Admissions Coordinator to follow up.  Retta Diones 04/15/2016  Delice Lesch, MD 04/15/16      Revision History     Date/Time User Provider Type Action   04/15/2016 1:48 PM Victor Lorie Phenix, MD Physician Sign   04/15/2016 1:11 PM Retta Diones, RN Rehab Admission Coordinator Share   View Details Report

## 2016-04-15 NOTE — Progress Notes (Signed)
Patient arrived on unit, translator at bedside to assist with admission. No complaints. Vitals stable. Resting comfortably.

## 2016-04-15 NOTE — H&P (Signed)
Physical Medicine and Rehabilitation Admission H&P    Chief Complaint  Patient presents with  . Weakness  . Dizziness  : HPI: 60 year old right-handed non-English speaking male from [place] with no significant medical history except tobacco abuse. By chart review patient lives alone but does have a roommate currently working in [facility] and cannot assist. He does have a girlfriend that can assist but is awaiting a surgery. One level apartment with level entry. 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 Initial cranial CT scan negative. MRI of the brain showed diffusion abnormality right thalamus and throughout right hippocampal formation. 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 Severe stenosis distal right vertebral artery. Patient did not receive TPA. Carotid Dopplers in no ICA stenosis. Echocardiogram with ejection fraction of 65% no wall motion abnormalities. Grade 1 diastolic dysfunction. Neurology consulted maintain on aspirin for CVA prophylaxis. Subcutaneous Lovenox for DVT prophylaxis. Currently maintained on a regular consistency diet. Lopressor has been added for blood pressure control. Reported bouts of headaches using Fioricet and monitor closely. Physical and occupational therapy evaluations completed 04/14/2016 with recommendations of physical medicine rehabilitation consult.  Review of Systems  Neurological: Positive for tingling, sensory change, weakness and headaches.  All other systems reviewed and are negative.  Past Medical History  Diagnosis Date  . Medical history non-contributory    Past Surgical History  Procedure Laterality Date  . No past surgeries     Family History  Problem  Relation Age of Onset  . Stroke Neg Hx    Social History:  reports that he has been smoking.  He does not have any smokeless tobacco history on file. He reports that he drinks alcohol. He reports that he does not use illicit drugs. Allergies: No Known Allergies No prescriptions prior to admission    Home: Home Living Family/patient expects to be discharged to:: Private residence Living Arrangements: Alone, Non-relatives/Friends (Pt lives with roommate but roommate currently working in [facility] and not around to assist) Available Help at Discharge:  (Pt's girlfriend able to assist until next Thursday when she is going to have surgery) Type of Home: Apartment Home Access: Level entry Home Layout: One level Bathroom Shower/Tub: Tub/shower unit, Architectural technologist: Standard Home Equipment: None   Functional History: Prior Function Level of Independence: Independent Comments: Working in Architect, driving.  Functional Status:  Mobility: Bed Mobility Overal bed mobility: Needs Assistance Bed Mobility: Supine to Sit Supine to sit: Supervision, HOB elevated General bed mobility comments: SBA for safety Transfers Overall transfer level: Needs assistance Equipment used: Rolling walker (2 wheeled) Transfers: Sit to/from Stand Sit to Stand: Min assist General transfer comment: vc's required for hand and foot placement and transfer technique/walker use (pt required tactile cues for L hand placement on RW) Ambulation/Gait Ambulation/Gait assistance: Min assist, +2 safety/equipment (chair follow) Ambulation Distance (Feet): 60 Feet Assistive device: Rolling walker (2 wheeled) General Gait Details: increased L lateral sway and towards end of ambulation loss of balance to R requiring assist to maintain balance; decreased L push-off and pt catching L forefoot on ground consistently each step (just prior to swing phase) with decreased L hip flexion AROM noted during swing  phase Gait velocity: mildly decreased    ADL: ADL Overall ADL's : Needs assistance/impaired Eating/Feeding: Set up,  Minimal assistance Grooming: Minimal assistance, Set up Lower Body Dressing: Moderate assistance General ADL Comments: Pt. presents with difficulty performing ADL tasks that require bilateral hand use, and has difficulty incorporating his LUE and hand into the ADL tasks.  Cognition: Cognition Overall Cognitive Status: Within Functional Limits for tasks assessed Orientation Level: Oriented X4 Cognition Arousal/Alertness: Awake/alert Behavior During Therapy: WFL for tasks assessed/performed Overall Cognitive Status: Within Functional Limits for tasks assessed  Physical Exam: Blood pressure 126/95, pulse 79, temperature 97.6 F (36.4 C), temperature source Oral, resp. rate 18, height 5\' 6"  (1.676 m), weight 82.464 kg (181 lb 12.8 oz), SpO2 97 %. Physical Exam  Vitals reviewed. Constitutional: He appears well-developed and well-nourished.  HENT:  Head: Normocephalic and atraumatic.  Eyes: Conjunctivae and EOM are normal.  Neck: Normal range of motion. Neck supple. No thyromegaly present.  Cardiovascular: Normal rate and regular rhythm.   Respiratory: Effort normal and breath sounds normal. No respiratory distress.  GI: Soft. Bowel sounds are normal. He exhibits no distension.  Musculoskeletal: Normal range of motion. He exhibits no edema or tenderness.  Neurological: He is alert.  Patient very limited English speaking.  He does follow demonstrated commands.  Sensation diminished light touch left upper and left lower extremity  DTRs symmetric  Motor: Right upper extremity/right lower extremity: 5/5 proximal to distal Left upper extremity/left lower extremity 4+/5 proximal to distal. Decreased FMC left upper extremity  Skin: Skin is warm and dry.  Psychiatric: He has a normal mood and affect. His behavior is normal.    Results for orders placed or performed  during the hospital encounter of 04/13/16 (from the past 48 hour(s))  Hemoglobin A1c     Status: None   Collection Time: 04/14/16  5:35 AM  Result Value Ref Range   Hgb A1c MFr Bld 5.7 4.0 - 6.0 %  Lipid panel     Status: Abnormal   Collection Time: 04/14/16  5:35 AM  Result Value Ref Range   Cholesterol 165 0 - 200 mg/dL   Triglycerides 725 (H) <150 mg/dL   HDL 26 (L) >40 mg/dL   Total CHOL/HDL Ratio 6.3 RATIO   VLDL UNABLE TO CALCULATE IF TRIGLYCERIDE OVER 400 mg/dL 0 - 40 mg/dL   LDL Cholesterol UNABLE TO CALCULATE IF TRIGLYCERIDE OVER 400 mg/dL 0 - 99 mg/dL    Comment:        Total Cholesterol/HDL:CHD Risk Coronary Heart Disease Risk Table                     Men   Women  1/2 Average Risk   3.4   3.3  Average Risk       5.0   4.4  2 X Average Risk   9.6   7.1  3 X Average Risk  23.4   11.0        Use the calculated Patient Ratio above and the CHD Risk Table to determine the patient's CHD Risk.        ATP III CLASSIFICATION (LDL):  <100     mg/dL   Optimal  100-129  mg/dL   Near or Above                    Optimal  130-159  mg/dL   Borderline  160-189  mg/dL   High  >190     mg/dL   Very High    Mr [facility] Wo Contrast  04/14/2016  CLINICAL DATA:  Stroke.  Left-sided weakness. EXAM: MRA HEAD WITHOUT CONTRAST TECHNIQUE: Angiographic images of the Circle of Willis were obtained using MRA technique without intravenous contrast. COMPARISON:  MRI head 04/13/2016 FINDINGS: Severe diffuse stenosis distal right vertebral artery with more normal caliber at the C1 level. This is likely due to atherosclerotic disease in the distal right vertebral artery. Left vertebral artery patent. Left PICA patent. Right PICA not visualized. Basilar widely patent. Occlusion of the proximal right posterior cerebral artery accounting for the acute infarct. Moderate stenosis in the mid left posterior cerebral artery. Superior cerebellar arteries are patent but small Internal carotid artery patent  bilaterally. Mild stenosis left cavernous carotid. Hypoplastic right A1 segment. Both anterior cerebral arteries are patent and supplied from the left. Middle cerebral arteries patent bilaterally Negative for cerebral aneurysm. 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 Severe stenosis distal right vertebral artery. This is probably due to atherosclerotic disease and could be a source of embolus to the right posterior cerebral artery. Electronically Signed   By: Franchot Gallo M.D.   On: 04/14/2016 11:06   Mr Brain Wo Contrast  04/13/2016  CLINICAL DATA:  60 year old male with left side weakness (arm greater than leg) since last known to be normal on 04/11/2016. Dizziness. Initial encounter. EXAM: MRI HEAD WITHOUT CONTRAST TECHNIQUE: Multiplanar, multiecho pulse sequences of the brain and surrounding structures were obtained without intravenous contrast. COMPARISON:  Head CT 0428 hours today. FINDINGS: 15 mm curvilinear segment of restricted diffusion in the right thalamus tracking toward the posterior limb of the right internal capsule. Superimposed diffusion restriction throughout the right hippocampal formation (series 9, image 17). Minimal associated T2 and FLAIR hyperintensity. No associated hemorrhage or mass effect. Major intracranial vascular flow voids are preserved. No other parenchymal restricted diffusion. No signal abnormality elsewhere in the right temporal lobe. The right insula is normal. There is a superimposed 2 cm left operculum dural based lesion (series 12, image 15 and series 15, image 16) which was occult on the earlier CT and is most compatible with meningioma. There is mild regional mass effect on the left temporal and frontal operculum, but no associated cerebral edema. Evidence of a broad-based dural attachment on series 12. No contrast administered. No other intracranial mass  lesion or mass effect. No ventriculomegaly or acute intracranial hemorrhage. Cervicomedullary junction and pituitary are within normal limits. Outside of the acute findings above, gray and white matter signal is within normal limits for age. No cortical encephalomalacia or chronic cerebral blood products identified. Visible internal auditory structures appear normal. Mastoids are clear. Trace paranasal sinus mucosal thickening. Negative orbit and scalp soft tissues. Visualized bone marrow signal is within normal limits. IMPRESSION: 1. Diffusion abnormality in the right thalamus and throughout the right hippocampal formation, favor this is all post ischemic such as from right PCA territory small vessel disease. No associated hemorrhage or mass effect. 2. Superimposed 2 cm left lateral convexity probable meningioma at the level of the left operculum. Mild regional mass effect with no cerebral edema. Burtis Junes this is clinically silent. Electronically Signed   By: Genevie Ann M.D.   On: 04/13/2016 14:21   US Carotid Bilateral  04/14/2016  CLINICAL DATA:  TIA.  Blurred vision.  Previous tobacco abuse. EXAM: BILATERAL CAROTID DUPLEX ULTRASOUND TECHNIQUE: Pearline Cables scale imaging, color Doppler and duplex ultrasound was performed of bilateral carotid and vertebral arteries in the neck. COMPARISON:  None. REVIEW OF SYSTEMS: Quantification of carotid  stenosis is based on velocity parameters that correlate the residual internal carotid diameter with NASCET-based stenosis levels, using the diameter of the distal internal carotid lumen as the denominator for stenosis measurement. The following velocity measurements were obtained: PEAK SYSTOLIC/END DIASTOLIC RIGHT ICA:                     89/26cm/sec CCA:                     [id number] SYSTOLIC ICA/CCA RATIO:  0.9 DIASTOLIC ICA/CCA RATIO: 0.9 ECA:                     79cm/sec LEFT ICA:                     75/32cm/sec CCA:                     [id number] SYSTOLIC ICA/CCA RATIO:  0.9  DIASTOLIC ICA/CCA RATIO: 1.4 ECA:                     89cm/sec FINDINGS: RIGHT CAROTID ARTERY: No significant plaque or stenosis. Normal waveforms and color Doppler signal. RIGHT VERTEBRAL ARTERY:  Normal flow direction and waveform. LEFT CAROTID ARTERY: No significant plaque or stenosis. Normal waveforms and color Doppler signal. LEFT VERTEBRAL ARTERY: Normal flow direction and waveform. IMPRESSION: 1. No significant carotid plaque or stenosis. Electronically Signed   By: Lucrezia Europe M.D.   On: 04/14/2016 10:20   Medical Problem List and Plan: 1.  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  DVT Prophylaxis/Anticoagulation: Subcutaneous Lovenox. Monitor platelet counts of any signs of bleeding 3. Pain Management: Fioricet for headaches 4. Hypertension. Lopressor 25 mg twice a day 5. Neuropsych: This patient is capable of making decisions on his own behalf. 6. Skin/Wound Care: Routine skin checks 7. Fluids/Electrolytes/Nutrition: Routine I&O with follow-up chemistries 8. Hyperlipidemia. Lipitor 9. Tobacco abuse. Counseling  Post Admission Physician Evaluation: 1. 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 2. Patient is admitted to receive collaborative, interdisciplinary care between the physiatrist, rehab nursing staff, and therapy team. 3. Patient's level of medical complexity and substantial therapy needs in context of that medical necessity cannot be provided at a lesser intensity of care such as a SNF. 4. Patient has experienced substantial functional loss from his/her baseline which was documented above under the "Functional History" and "Functional Status" headings.  Judging by the patient's diagnosis, physical exam, and functional history, the patient has potential for functional progress which will result in measurable gains while on inpatient rehab.  These gains will be of substantial and  practical use upon discharge  in facilitating mobility and self-care at the household level. 5. Physiatrist will provide 24 hour management of medical needs as well as oversight of the therapy plan/treatment and provide guidance as appropriate regarding the interaction of the two. 6. 24 hour rehab nursing will assist with safety, disease management and patient education  and help integrate therapy concepts, techniques,education, etc. 7. PT will assess and treat for/with: Lower extremity strength, range of motion, stamina, balance, functional mobility, safety, adaptive techniques and equipment, coping skills, pain control, stroke education.   Goals are: Mod I. 8. OT will assess and treat for/with: ADL's, functional mobility, safety, upper extremity strength, adaptive techniques and equipment, ego support, and community reintegration.   Goals are: Mod I. Therapy may proceed  with showering this patient. 9. Case Management and Social Worker will assess and treat for psychological issues and discharge planning. 10. Team conference will be held weekly to assess progress toward goals and to determine barriers to discharge. 11. Patient will receive at least 3 hours of therapy per day at least 5 days per week. 12. ELOS: 6-9 days.       13. Prognosis:  good  Delice Lesch, MD 04/15/2016

## 2016-04-15 NOTE — Progress Notes (Signed)
Patient information reviewed and entered into eRehab system by Aoki Wedemeyer, RN, CRRN, PPS Coordinator.  Information including medical coding and functional independence measure will be reviewed and updated through discharge.    

## 2016-04-16 ENCOUNTER — Inpatient Hospital Stay (HOSPITAL_COMMUNITY): Payer: MEDICAID | Admitting: Physical Therapy

## 2016-04-16 ENCOUNTER — Inpatient Hospital Stay (HOSPITAL_COMMUNITY): Payer: MEDICAID | Admitting: Occupational Therapy

## 2016-04-16 ENCOUNTER — Inpatient Hospital Stay (HOSPITAL_COMMUNITY): Payer: Self-pay | Admitting: Occupational Therapy

## 2016-04-16 DIAGNOSIS — I63331 Cerebral infarction due to thrombosis of right posterior cerebral artery: Secondary | ICD-10-CM

## 2016-04-16 LAB — CBC WITH DIFFERENTIAL/PLATELET
BASOS ABS: 0 10*3/uL (ref 0.0–0.1)
BASOS PCT: 1 %
EOS ABS: 0.5 10*3/uL (ref 0.0–0.7)
EOS PCT: 8 %
HCT: 48.3 % (ref 39.0–52.0)
Hemoglobin: 16.2 g/dL (ref 13.0–17.0)
LYMPHS PCT: 22 %
Lymphs Abs: 1.4 10*3/uL (ref 0.7–4.0)
MCH: 30.3 pg (ref 26.0–34.0)
MCHC: 33.5 g/dL (ref 30.0–36.0)
MCV: 90.4 fL (ref 78.0–100.0)
Monocytes Absolute: 0.7 10*3/uL (ref 0.1–1.0)
Monocytes Relative: 11 %
Neutro Abs: 3.8 10*3/uL (ref 1.7–7.7)
Neutrophils Relative %: 60 %
PLATELETS: 197 10*3/uL (ref 150–400)
RBC: 5.34 MIL/uL (ref 4.22–5.81)
RDW: 12.8 % (ref 11.5–15.5)
WBC: 6.4 10*3/uL (ref 4.0–10.5)

## 2016-04-16 LAB — COMPREHENSIVE METABOLIC PANEL
ALBUMIN: 3.6 g/dL (ref 3.5–5.0)
ALT: 22 U/L (ref 17–63)
AST: 23 U/L (ref 15–41)
Alkaline Phosphatase: 71 U/L (ref 38–126)
Anion gap: 12 (ref 5–15)
BUN: 19 mg/dL (ref 6–20)
CHLORIDE: 104 mmol/L (ref 101–111)
CO2: 25 mmol/L (ref 22–32)
CREATININE: 1.22 mg/dL (ref 0.61–1.24)
Calcium: 9.1 mg/dL (ref 8.9–10.3)
GFR calc Af Amer: >60 mL/min (ref >60)
GFR calc non Af Amer: >60 mL/min (ref >60)
GLUCOSE: 112 mg/dL — AB (ref 65–99)
Potassium: 4.1 mmol/L (ref 3.5–5.1)
SODIUM: 141 mmol/L (ref 135–145)
Total Bilirubin: 1.2 mg/dL (ref 0.3–1.2)
Total Protein: 7 g/dL (ref 6.5–8.1)

## 2016-04-16 NOTE — Care Management Note (Signed)
[facility] Individual Statement of Services  Patient Name:  Victor Calhoun  Date:  04/16/2016  Welcome to the [facility].  Our goal is to provide you with an individualized program based on your diagnosis and situation, designed to meet your specific needs.  With this comprehensive rehabilitation program, you will be expected to participate in at least 3 hours of rehabilitation therapies Monday-Friday, with modified therapy programming on the weekends.  Your rehabilitation program will include the following services:  Physical Therapy (PT), Occupational Therapy (OT), 24 hour per day rehabilitation nursing, Case Management (Social Worker), Rehabilitation Medicine, Nutrition Services and Pharmacy Services  Weekly team conferences will be held on Wednesday to discuss your progress.  Your Social Worker will talk with you frequently to get your input and to update you on team discussions.  Team conferences with you and your family in attendance may also be held.  Expected length of stay: 7-9 DAYS  Overall anticipated outcome: MOD/I LEVEL  Depending on your progress and recovery, your program may change. Your Social Worker will coordinate services and will keep you informed of any changes. Your Social Worker's name and contact numbers are listed  below.  The following services may also be recommended but are not provided by the [facility] will be made to provide these services after discharge if needed.  Arrangements include referral to agencies that provide these services.  Your insurance has been verified to be:  None Your primary doctor is:  None  Pertinent information will be shared with your doctor and your insurance company.  Social Worker:  Ovidio Kin, [place] or [phone]  Information discussed with and copy given to patient by: Elease Hashimoto, 04/16/2016, 3:05 PM

## 2016-04-16 NOTE — Evaluation (Signed)
Occupational Therapy Assessment and Plan  Patient Details  Name: Victor Calhoun MRN: [id number] Date of Birth: 03-Nov-1956  OT Diagnosis: apraxia and hemiplegia affecting non-dominant side Rehab Potential: Rehab Potential (ACUTE ONLY): Good ELOS: 7-9 days   Today's Date: 04/16/2016 OT Individual Time: 4098-1191 OT Individual Time Calculation (min): 75 min     Problem List:  Patient Active Problem List   Diagnosis Date Noted  . CVA (cerebral vascular accident) ([place]) 04/15/2016  . Hemiparesis ([place])   . Other vascular headache   . Benign essential HTN   . HLD (hyperlipidemia)   . Tobacco abuse   . Acute CVA (cerebrovascular accident) ([place]) 04/14/2016  . TIA (transient ischemic attack) 04/13/2016    Past Medical History:  Past Medical History  Diagnosis Date  . Medical history non-contributory    Past Surgical History:  Past Surgical History  Procedure Laterality Date  . No past surgeries      Assessment & Plan Clinical Impression: Patient is a 60 y.o. year old male right-handed non-English speaking male from [place] with no significant medical history except tobacco abuse. By chart review patient lives alone but does have a roommate currently working in [facility] [employer] and cannot assist. He does have a girlfriend that can assist but is awaiting a surgery. One level apartment with level entry. 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 Initial cranial CT scan negative. MRI of the brain showed diffusion abnormality right thalamus and throughout right hippocampal formation. 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 Severe stenosis distal right vertebral artery. Patient did not receive TPA. Carotid Dopplers  in no ICA stenosis. Echocardiogram with ejection fraction of 65% no wall motion abnormalities. Grade 1 diastolic dysfunction. Neurology consulted maintain on aspirin for CVA prophylaxis. Subcutaneous Lovenox for DVT prophylaxis. Currently maintained on a regular consistency diet. Lopressor has been added for blood pressure control. Reported bouts of headaches using Fioricet and monitor closely. Patient transferred to CIR on 04/15/2016 .    Patient currently requires min with basic self-care skills and basic mobility secondary to muscle weakness, motor apraxia and decreased coordination, decreased visual perceptual skills, decreased attention, decreased awareness, decreased problem solving, decreased safety awareness and delayed processing and decreased standing balance and decreased balance strategies.  Prior to hospitalization, patient could complete ADL with independent .  Patient will benefit from skilled intervention to decrease level of assist with basic self-care skills and increase independence with basic self-care skills prior to discharge home with care partner.  Anticipate patient will require intermittent supervision and follow up outpatient.  OT - End of Session Activity Tolerance: Tolerates 30+ min activity without fatigue OT Assessment Rehab Potential (ACUTE ONLY): Good OT Patient demonstrates impairments in the following area(s): Balance;Cognition;Endurance;Motor;Perception;Pain;Safety;Sensory OT Basic ADL's Functional Problem(s): Grooming;Bathing;Dressing;Toileting OT Advanced ADL's Functional Problem(s): Simple Meal Preparation OT Transfers Functional Problem(s): Toilet;Tub/Shower OT Additional Impairment(s): Fuctional Use of Upper Extremity OT Plan OT Intensity: Minimum of 1-2 x/day, 45 to 90 minutes OT Frequency: 5 out of 7 days OT Duration/Estimated Length of Stay: 7-9 days OT Treatment/Interventions: Balance/vestibular training;Cognitive remediation/compensation;Community  reintegration;Discharge planning;DME/adaptive equipment instruction;Disease mangement/prevention;Functional mobility training;Functional electrical stimulation;Neuromuscular re-education;Patient/family education;Self Care/advanced ADL retraining;Therapeutic Exercise;UE/LE Coordination activities;Visual/perceptual remediation/compensation;UE/LE Strength taining/ROM;Therapeutic Activities;Skin care/wound managment;Pain management;Psychosocial support OT Self Feeding Anticipated Outcome(s): n/a OT Basic Self-Care Anticipated Outcome(s): mod I  OT Toileting Anticipated Outcome(s): mod I  OT Bathroom Transfers Anticipated Outcome(s): mod I  OT Recommendation Recommendations for Other Services: Neuropsych consult (cognitive testing) Patient destination: Home Follow Up Recommendations: Outpatient OT Equipment Recommended: To be determined   Skilled Therapeutic Intervention Ot eval initiated with OT purpose, role and goals discussed. Interpreter present for eval.self care retraining at shower level with focus on functional ambulation with min A around room, functional problem solving with min A. Dynamic standing balance with bathing and dressing with min A, forced functional use of left UE to increase awareness and coordination. With functional ambulation in the hallway pt requires more than reasonable amt of time to path find and decr body awareness of left bumping into objects. Stroke education provided on risk factors for stroke and promote health lifestyle.   OT Evaluation Precautions/Restrictions  Precautions Precautions: Fall Precaution Comments: decr body awareness and decr sensation Restrictions Weight Bearing Restrictions: No General Chart Reviewed: Yes Family/Caregiver Present: No Vital Signs   Pain Pain Assessment Pain Assessment: Faces Pain Score: 10-Worst pain ever Faces Pain Scale: Hurts little more Pain Type: Acute pain Pain Location: Head Pain Descriptors / Indicators:  Aching Pain Frequency: Occasional Pain Onset: Gradual Patients Stated Pain Goal: 2 Pain Intervention(s): Medication (See eMAR) Home Living/Prior Functioning Home Living Family/patient expects to be discharged to:: Private residence Living Arrangements: Non-relatives/Friends Available Help at Discharge: Available PRN/intermittently Type of Home: Apartment Home Access: Level entry Home Layout: One level Bathroom Shower/Tub: Tub/shower unit, Architectural technologist: Standard  Lives With:  (roommate) ADL ADL ADL Comments: see functional navigator Vision/Perception  Vision- History Baseline Vision/History: Wears glasses Wears Glasses: Reading only Patient Visual Report: No change from baseline  Cognition Arousal/Alertness: Awake/alert Orientation Level: Person;Place;Situation Person: Oriented Place: Oriented Situation: Oriented Year: 2017 Month: April Day of Week: Incorrect (wednesday) Memory: Impaired Memory Impairment: Retrieval deficit;Decreased recall of new information Immediate Memory Recall: Sock;Blue;Bed Memory Recall: Sock;Blue;Bed Memory Recall Sock: Without Cue Memory Recall Blue: Without Cue Memory Recall Bed: Without Cue Attention: Selective Selective Attention: Impaired Awareness: Impaired Awareness Impairment: Emergent impairment Problem Solving: Impaired Problem Solving Impairment: Functional basic Safety/Judgment: Appears intact (will continue to assess) Sensation Sensation Light Touch: Impaired Detail Light Touch Impaired Details: Impaired LUE;Impaired LLE Proprioception: Impaired Detail Proprioception Impaired Details: Impaired LUE Coordination Gross Motor Movements are Fluid and Coordinated: No Fine Motor Movements are Fluid and Coordinated: No Coordination and Movement Description: decr rapid alternating movements, decr FMC with and without visual feedback Finger Nose Finger Test: slower on the left Motor  Motor Motor: Hemiplegia Mobility   Transfers Transfers: Sit to Stand;Stand to Sit Sit to Stand: 4: Min assist Stand to Sit: 4: Min assist  Trunk/Postural Assessment  Cervical Assessment Cervical Assessment: Within Functional Limits Thoracic Assessment Thoracic Assessment: Within Functional Limits Lumbar Assessment Lumbar Assessment: Within Functional Limits Postural Control Postural Control: Deficits on evaluation Protective Responses: delayed  Balance Balance Balance Assessed: Yes Static Sitting Balance Static Sitting - Level of Assistance: 6: Modified independent (Device/Increase time) Dynamic Sitting Balance Dynamic Sitting - Level of Assistance: 5: Stand by assistance Static Standing Balance Static Standing - Balance Support: During functional activity Static Standing - Level of Assistance: 4: Min assist Dynamic Standing Balance Dynamic Standing - Balance Support: During functional activity Dynamic Standing - Level of Assistance: 3: Mod assist;4: Min assist Extremity/Trunk Assessment RUE Assessment RUE Assessment: Within Functional Limits LUE Assessment LUE Assessment:  (4/5; WFL tone)   See Function Navigator for Current Functional Status.   Refer to Care Plan for  Long Term Goals  Recommendations for other services: Neuropsych- cognitive   Discharge Criteria: Patient will be discharged from OT if patient refuses treatment 3 consecutive times without medical reason, if treatment goals not met, if there is a change in medical status, if patient makes no progress towards goals or if patient is discharged from hospital.  The above assessment, treatment plan, treatment alternatives and goals were discussed and mutually agreed upon: by patient  Nicoletta Ba 04/16/2016, 9:26 AM

## 2016-04-16 NOTE — Evaluation (Signed)
Physical Therapy Assessment and Plan  Patient Details  Name: Victor Calhoun MRN: [id number] Date of Birth: May 29, 1956  PT Diagnosis: Hemiparesis non-dominant, Impaired sensation and Muscle weakness Rehab Potential: Good ELOS: 6-9 days   Today's Date: 04/16/2016 PT Individual Time: 1000-1100 PT Individual Time Calculation (min): 60 min    Problem List:  Patient Active Problem List   Diagnosis Date Noted  . CVA (cerebral vascular accident) ([place]) 04/15/2016  . Hemiparesis ([place])   . Other vascular headache   . Benign essential HTN   . HLD (hyperlipidemia)   . Tobacco abuse   . Acute CVA (cerebrovascular accident) ([place]) 04/14/2016  . TIA (transient ischemic attack) 04/13/2016    Past Medical History:  Past Medical History  Diagnosis Date  . Medical history non-contributory    Past Surgical History:  Past Surgical History  Procedure Laterality Date  . No past surgeries      Assessment & Plan Clinical Impression: A 60 year old right-handed non-English speaking male with no significant medical history except tobacco abuse. By chart review patient lives alone but does have a roommate currently working in [facility] [employer] and cannot assist. He does have a girlfriend that can assist but is awaiting a surgery. One level apartment with level entry. Patient independent prior to admission. Presented 04/13/2016 with left-sided weakness and dizziness after reportedly drinking several beers the night before. Initial cranial CT scan negative. MRI of the brain showed diffusion abnormality right thalamus and throughout right hippocampal formation. 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 Severe stenosis distal right vertebral artery. Patient did not receive TPA. Carotid Dopplers in no ICA stenosis. Echocardiogram with ejection fraction of 65% no wall  motion abnormalities. Grade 1 diastolic dysfunction. Neurology consulted maintain on aspirin for CVA prophylaxis. Subcutaneous Lovenox for DVT prophylaxis. Currently maintained on a regular consistency diet. Lopressor has been added for blood pressure control. Reported bouts of headaches using Fioricet and monitor closely. Physical and occupational therapy evaluations completed 04/14/2016 with recommendations of physical medicine rehabilitation consult. Patient transferred to CIR on 04/15/2016 .   Patient currently requires supervision with mobility secondary to muscle weakness, decreased initiation, decreased attention, decreased awareness, decreased problem solving, decreased memory and delayed processing and decreased standing balance, decreased postural control and decreased balance strategies.  Prior to hospitalization, patient was independent  with mobility and lived with  (roommate) in a [facility] home.  Home access is  Level entry.  Patient will benefit from skilled PT intervention to maximize safe functional mobility and minimize fall risk for planned discharge home with intermittent assist.  Anticipate patient will not need PT follow up at discharge.  PT - End of Session Activity Tolerance: Tolerates 30+ min activity without fatigue Endurance Deficit: No PT Assessment Rehab Potential (ACUTE/IP ONLY): Good Barriers to Discharge: Decreased caregiver support PT Patient demonstrates impairments in the following area(s): Balance;Endurance;Motor;Safety PT Transfers Functional Problem(s): Bed Mobility;Bed to Chair;Car;Furniture;Floor PT Locomotion Functional Problem(s): Ambulation;Stairs PT Plan PT Intensity: Minimum of 1-2 x/day ,45 to 90 minutes PT Frequency: 5 out of 7 days PT Duration Estimated Length of Stay: 6-9 days PT Treatment/Interventions: Ambulation/gait training;Balance/vestibular training;Disease management/prevention;Community reintegration;Discharge planning;Neuromuscular  re-education;Functional mobility training;Psychosocial support;Patient/family education;Therapeutic Activities;Stair training;UE/LE Strength taining/ROM;Therapeutic Exercise;UE/LE Coordination activities PT Transfers Anticipated Outcome(s): mod I PT Locomotion Anticipated Outcome(s): mod I in home, S stairs and community gait PT Recommendation Follow Up Recommendations: None Patient destination: Home Equipment  Recommended: None recommended by PT  Skilled Therapeutic Intervention Pt received seated in recliner, c/o mild headache which he does not rate and reports it is improving, otherwise agreeable to treatment. PT initial evaluation performed and completed with use of interpreter; pt requiring S overall for mobility tasks. Note cognitive impairments, mild L inattention, delayed initiation, attention and awareness. Educated pt in rehab process, goals, estimated length of stay, and falls prevention safety with use of call bell for assistance when getting up. Pt agreeable to all the above. Remained seated on EOB with admissions coordinator present at completion of session, all needs within reach.   PT Evaluation Precautions/Restrictions Precautions Precautions: Fall Precaution Comments: decr body awareness and decr sensation Restrictions Weight Bearing Restrictions: No General Chart Reviewed: Yes Response to Previous Treatment: Patient with no complaints from previous session. Family/Caregiver Present: No  Pain  C/o mild headache; does not rate Home Living/Prior Functioning Home Living Available Help at Discharge: Available PRN/intermittently Type of Home: Apartment Home Access: Level entry Home Layout: One level Bathroom Shower/Tub: Tub/shower unit;Curtain Biochemist, clinical: Standard  Lives With:  (roommate) Vision/Perception    mild L inattention Cognition Arousal/Alertness: Awake/alert Attention: Selective Selective Attention: Impaired Memory: Impaired Memory Impairment:  Retrieval deficit;Decreased recall of new information Awareness: Impaired Awareness Impairment: Emergent impairment Problem Solving: Impaired Problem Solving Impairment: Functional basic Safety/Judgment: Appears intact (will continue to assess) Sensation Sensation Light Touch: Impaired Detail Light Touch Impaired Details: Impaired LUE;Impaired LLE Proprioception: Impaired Detail Proprioception Impaired Details: Impaired LUE Coordination Gross Motor Movements are Fluid and Coordinated: No Fine Motor Movements are Fluid and Coordinated: No Coordination and Movement Description: decr rapid alternating movements, decr FMC with and without visual feedback Finger Nose Finger Test: slower on the left Motor  Motor Motor - Skilled Clinical Observations: mild L hemiparesis  Mobility Bed Mobility Bed Mobility: Supine to Sit;Sit to Supine Supine to Sit: 5: Supervision Supine to Sit Details: Verbal cues for precautions/safety Sit to Supine: 5: Supervision Sit to Supine - Details: Verbal cues for precautions/safety Transfers Transfers: Yes Sit to Stand: 5: Supervision Sit to Stand Details: Verbal cues for precautions/safety Stand to Sit: 5: Supervision Stand to Sit Details (indicate cue type and reason): Verbal cues for precautions/safety Stand Pivot Transfers: 5: Supervision Stand Pivot Transfer Details: Verbal cues for precautions/safety;Verbal cues for technique Locomotion  Ambulation Ambulation: Yes Ambulation/Gait Assistance: 5: Supervision;4: Min guard Ambulation Distance (Feet): 300 Feet Assistive device: None Ambulation/Gait Assistance Details: Verbal cues for precautions/safety;Verbal cues for technique Gait Gait: Yes Gait Pattern: Impaired Gait Pattern: Decreased stride length;Decreased weight shift to left Gait velocity: mildly decreased High Level Ambulation High Level Ambulation: Direction changes;Head turns Direction Changes: S, slow speed, no LOB Head Turns: S, no  change in gait speed Stairs / Additional Locomotion Stairs: Yes Stairs Assistance: 5: Supervision Stairs Assistance Details: Verbal cues for precautions/safety;Verbal cues for technique Stair Management Technique: Two rails;Alternating pattern;Forwards Number of Stairs: 12 Height of Stairs: 3 Ramp: 5: Supervision Curb: 5: Supervision Wheelchair Mobility Wheelchair Mobility: No  Trunk/Postural Assessment  Cervical Assessment Cervical Assessment: Within Functional Limits Thoracic Assessment Thoracic Assessment: Within Functional Limits Lumbar Assessment Lumbar Assessment: Within Functional Limits Postural Control Postural Control: Deficits on evaluation Protective Responses: delayed  Balance Balance Balance Assessed: Yes Standardized Balance Assessment Standardized Balance Assessment: Berg Balance Test;Dynamic Gait Index Berg Balance Test Sit to Stand: Able to stand without using hands and stabilize independently Standing Unsupported: Able to stand safely 2 minutes Sitting with Back Unsupported but Feet Supported on Floor or Stool: Able  to sit safely and securely 2 minutes Stand to Sit: Sits safely with minimal use of hands Transfers: Able to transfer safely, minor use of hands Standing Unsupported with Eyes Closed: Able to stand 10 seconds safely Standing Ubsupported with Feet Together: Able to place feet together independently and stand for 1 minute with supervision From Standing, Reach Forward with Outstretched Arm: Can reach forward >12 cm safely (5") From Standing Position, Pick up Object from Floor: Able to pick up shoe, needs supervision From Standing Position, Turn to Look Behind Over each Shoulder: Looks behind from both sides and weight shifts well Turn 360 Degrees: Able to turn 360 degrees safely one side only in 4 seconds or less Standing Unsupported, Alternately Place Feet on Step/Stool: Able to stand independently and complete 8 steps >20 seconds Standing  Unsupported, One Foot in Front: Able to plae foot ahead of the other independently and hold 30 seconds Standing on One Leg: Tries to lift leg/unable to hold 3 seconds but remains standing independently Total Score: 47 Dynamic Gait Index Level Surface: Normal Change in Gait Speed: Normal Gait with Horizontal Head Turns: Normal Gait with Vertical Head Turns: Normal Gait and Pivot Turn: Normal Step Over Obstacle: Mild Impairment Step Around Obstacles: Mild Impairment Steps: Normal Total Score: 22 Static Sitting Balance Static Sitting - Level of Assistance: 6: Modified independent (Device/Increase time) Dynamic Sitting Balance Dynamic Sitting - Balance Support: During functional activity;No upper extremity supported;Feet supported Dynamic Sitting - Level of Assistance: 5: Stand by assistance Dynamic Sitting - Balance Activities: Forward lean/weight shifting;Reaching for weighted objects;Reaching across midline Static Standing Balance Static Standing - Balance Support: During functional activity Static Standing - Level of Assistance: 5: Stand by assistance Static Stance: Eyes closed Static Stance: Eyes Closed: S x30 sec Dynamic Standing Balance Dynamic Standing - Balance Support: During functional activity Dynamic Standing - Level of Assistance: 5: Stand by assistance Dynamic Standing - Balance Activities: Forward lean/weight shifting;Reaching across midline;Reaching for weighted objects Extremity Assessment  RUE Assessment RUE Assessment: Within Functional Limits LUE Assessment LUE Assessment:  (4/5; WFL tone) RLE Assessment RLE Assessment: Within Functional Limits LLE Assessment LLE Assessment: Exceptions to [facility] (mild strength deficits, 4+/5 throughout)   See Function Navigator for Current Functional Status.   Refer to Care Plan for Long Term Goals  Recommendations for other services: None  Discharge Criteria: Patient will be discharged from PT if patient refuses treatment  3 consecutive times without medical reason, if treatment goals not met, if there is a change in medical status, if patient makes no progress towards goals or if patient is discharged from hospital.  The above assessment, treatment plan, treatment alternatives and goals were discussed and mutually agreed upon: by patient  Luberta Mutter 04/16/2016, 12:24 PM

## 2016-04-16 NOTE — Progress Notes (Signed)
60 year old right-handed non-English speaking male from [place] with no significant medical history except tobacco abuse. By chart review patient lives alone but does have a roommate currently working in [employer] and cannot assist. He does have a girlfriend that can assist but is awaiting a surgery. One level apartment with level entry. 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 Initial cranial CT scan negative. MRI of the brain showed diffusion abnormality right thalamus and throughout right hippocampal formation. 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 Severe stenosis distal right vertebral artery  Subjective/Complaints:   Objective: Vital Signs: Blood pressure 117/70, pulse 72, temperature 98 F (36.7 C), temperature source Oral, resp. rate 18, height 5' 6"  (1.676 m), weight 71.714 kg (158 lb 1.6 oz), SpO2 96 %. Mr Jodene Nam Head Wo Contrast  04/14/2016  CLINICAL DATA:  Stroke.  Left-sided weakness. EXAM: MRA HEAD WITHOUT CONTRAST TECHNIQUE: Angiographic images of the Circle of Willis were obtained using MRA technique without intravenous contrast. COMPARISON:  MRI head 04/13/2016 FINDINGS: Severe diffuse stenosis distal right vertebral artery with more normal caliber at the C1 level. This is likely due to atherosclerotic disease in the distal right vertebral artery. Left vertebral artery patent. Left PICA patent. Right PICA not visualized. Basilar widely patent. Occlusion of the proximal right posterior cerebral artery accounting for the acute infarct. Moderate stenosis in the mid left posterior cerebral artery. Superior cerebellar arteries are patent but small Internal carotid artery patent bilaterally. Mild stenosis left  cavernous carotid. Hypoplastic right A1 segment. Both anterior cerebral arteries are patent and supplied from the left. Middle cerebral arteries patent bilaterally Negative for cerebral aneurysm. 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 Severe stenosis distal right vertebral artery. This is probably due to atherosclerotic disease and could be a source of embolus to the right posterior cerebral artery. Electronically Signed   By: Franchot Gallo M.D.   On: 04/14/2016 11:06   US Carotid Bilateral  04/14/2016  CLINICAL DATA:  TIA.  Blurred vision.  Previous tobacco abuse. EXAM: BILATERAL CAROTID DUPLEX ULTRASOUND TECHNIQUE: Pearline Cables scale imaging, color Doppler and duplex ultrasound was performed of bilateral carotid and vertebral arteries in the neck. COMPARISON:  None. REVIEW OF SYSTEMS: Quantification of carotid stenosis is based on velocity parameters that correlate the residual internal carotid diameter with NASCET-based stenosis levels, using the diameter of the distal internal carotid lumen as the denominator for stenosis measurement. The following velocity measurements were obtained: PEAK SYSTOLIC/END DIASTOLIC RIGHT ICA:                     89/26cm/sec CCA:                     878/67EH/MCN SYSTOLIC ICA/CCA RATIO:  0.9 DIASTOLIC ICA/CCA RATIO: 0.9 ECA:                     79cm/sec LEFT ICA:                     75/32cm/sec CCA:                     47/09GG/EZM SYSTOLIC ICA/CCA RATIO:  0.9 DIASTOLIC ICA/CCA RATIO: 1.4  ECA:                     89cm/sec FINDINGS: RIGHT CAROTID ARTERY: No significant plaque or stenosis. Normal waveforms and color Doppler signal. RIGHT VERTEBRAL ARTERY:  Normal flow direction and waveform. LEFT CAROTID ARTERY: No significant plaque or stenosis. Normal waveforms and color Doppler signal. LEFT VERTEBRAL ARTERY: Normal flow direction and waveform. IMPRESSION: 1. No significant  carotid plaque or stenosis. Electronically Signed   By: Lucrezia Europe M.D.   On: 04/14/2016 10:20   Results for orders placed or performed during the hospital encounter of 04/15/16 (from the past 72 hour(s))  CBC     Status: None   Collection Time: 04/15/16  6:56 PM  Result Value Ref Range   WBC 7.3 4.0 - 10.5 K/uL   RBC 5.38 4.22 - 5.81 MIL/uL   Hemoglobin 16.6 13.0 - 17.0 g/dL   HCT 48.5 39.0 - 52.0 %   MCV 90.1 78.0 - 100.0 fL   MCH 30.9 26.0 - 34.0 pg   MCHC 34.2 30.0 - 36.0 g/dL   RDW 12.8 11.5 - 15.5 %   Platelets 215 150 - 400 K/uL  Creatinine, serum     Status: None   Collection Time: 04/15/16  6:56 PM  Result Value Ref Range   Creatinine, Ser 1.16 0.61 - 1.24 mg/dL   GFR calc non Af Amer >60 >60 mL/min   GFR calc Af Amer >60 >60 mL/min    Comment: (NOTE) The eGFR has been calculated using the CKD EPI equation. This calculation has not been validated in all clinical situations. eGFR's persistently <60 mL/min signify possible Chronic Kidney Disease.   CBC WITH DIFFERENTIAL     Status: None   Collection Time: 04/16/16  5:44 AM  Result Value Ref Range   WBC 6.4 4.0 - 10.5 K/uL   RBC 5.34 4.22 - 5.81 MIL/uL   Hemoglobin 16.2 13.0 - 17.0 g/dL   HCT 48.3 39.0 - 52.0 %   MCV 90.4 78.0 - 100.0 fL   MCH 30.3 26.0 - 34.0 pg   MCHC 33.5 30.0 - 36.0 g/dL   RDW 12.8 11.5 - 15.5 %   Platelets 197 150 - 400 K/uL   Neutrophils Relative % 60 %   Neutro Abs 3.8 1.7 - 7.7 K/uL   Lymphocytes Relative 22 %   Lymphs Abs 1.4 0.7 - 4.0 K/uL   Monocytes Relative 11 %   Monocytes Absolute 0.7 0.1 - 1.0 K/uL   Eosinophils Relative 8 %   Eosinophils Absolute 0.5 0.0 - 0.7 K/uL   Basophils Relative 1 %   Basophils Absolute 0.0 0.0 - 0.1 K/uL  Comprehensive metabolic panel     Status: Abnormal   Collection Time: 04/16/16  5:44 AM  Result Value Ref Range   Sodium 141 135 - 145 mmol/L   Potassium 4.1 3.5 - 5.1 mmol/L   Chloride 104 101 - 111 mmol/L   CO2 25 22 - 32 mmol/L   Glucose, Bld  112 (H) 65 - 99 mg/dL   BUN 19 6 - 20 mg/dL   Creatinine, Ser 1.22 0.61 - 1.24 mg/dL   Calcium 9.1 8.9 - 10.3 mg/dL   Total Protein 7.0 6.5 - 8.1 g/dL   Albumin 3.6 3.5 - 5.0 g/dL   AST 23 15 - 41 U/L   ALT 22 17 - 63 U/L   Alkaline Phosphatase 71 38 - 126 U/L   Total Bilirubin 1.2 0.3 - 1.2  mg/dL   GFR calc non Af Amer >60 >60 mL/min   GFR calc Af Amer >60 >60 mL/min    Comment: (NOTE) The eGFR has been calculated using the CKD EPI equation. This calculation has not been validated in all clinical situations. eGFR's persistently <60 mL/min signify possible Chronic Kidney Disease.    Anion gap 12 5 - 15     HEENT: poor dentition Cardio: RRR and no murmur Resp: CTA B/L and unlabored GI: BS positive and NT, ND Extremity:  Pulses positive and No Edema Skin:   Other vitiligo Neuro: Alert/Oriented, Abnormal Sensory reduced in LUE and LLE, Abnormal Motor 4/5 in LUE , 5/5 in RUE and in LEs, Abnormal FMC Ataxic/ dec FMC and Dysarthric Musc/Skel:  Other no pain with UE or LE ROM Gen NAD   Assessment/Plan: 1. Functional deficits secondary to  right thalamus and right hippocampus infarct, Left hemisensory deficits with ataxia which require 3+ hours per day of interdisciplinary therapy in a comprehensive inpatient rehab setting. Physiatrist is providing close team supervision and 24 hour management of active medical problems listed below. Physiatrist and rehab team continue to assess barriers to discharge/monitor patient progress toward functional and medical goals. FIM:                                   Medical Problem List and Plan: 1.  Left side weakness and dizziness secondary to right thalamus and right hippocampus infarct, R PCA occlusion 2.  DVT Prophylaxis/Anticoagulation: Subcutaneous Lovenox. Monitor platelet counts of any signs of bleeding 3. Pain Management: Fioricet for headaches, will start topamax for prophyllaxis 4. Hypertension. Lopressor 25 mg  twice a day, BP well controlled 5. Neuropsych: This patient is capable of making decisions on his own behalf. 6. Skin/Wound Care: Routine skin checks 7. Fluids/Electrolytes/Nutrition: Routine I&O with follow-up chemistries 8. Hyperlipidemia. Lipitor 9. Tobacco abuse. Counseling  LOS (Days) 1 A FACE TO FACE EVALUATION WAS PERFORMED  KIRSTEINS,ANDREW E 04/16/2016, 7:42 AM

## 2016-04-16 NOTE — Progress Notes (Signed)
Occupational Therapy Session Note  Patient Details  Name: Victor Calhoun MRN: [id number] Date of Birth: November 25, 1956  Today's Date: 04/16/2016 OT Individual Time: 1310-1410 OT Individual Time Calculation (min): 60 min    Short Term Goals: Week 1:  OT Short Term Goal 1 (Week 1): Pt will not bump into objects in left environment in a 60 min session with min questioning cues  OT Short Term Goal 2 (Week 1): Pt will perform shower standing with supervision  OT Short Term Goal 3 (Week 1): Pt will use bilateral UE in 3/3 grooming tasks at sink without cuing  OT Short Term Goal 4 (Week 1): Pt will perform grooming tasks at sink mod I   Skilled Therapeutic Interventions/Progress Updates:    Treatment session with focus on functional use of LUE, attention to Lt during mobility, and further assessment of cognition.  Therapist issued the [place] with assistance from the interpreter with pt scoring 15/30, with most difficulty with visuospacial, language, and delayed recall.  Discussed carryover of assessment into functional implications.  Ambulated to [facility] gym without assistive device at supervision level.  Engaged in [place] with focus on Lt attention and use of LUE to increase body awareness.  Pt scoring 17, 21, 26 on three trials.  Noted increased time initially with each trial then improved speed as trial continued, mild initiation deficits noted with task.  Pt bumped into 2 items on Lt during ambulation to gym and required cues to scan to Lt to locate pt's room upon return.  Therapy Documentation Precautions:  Precautions Precautions: Fall Precaution Comments: decr body awareness and decr sensation Restrictions Weight Bearing Restrictions: No Pain:  Pt with no c/o pain ADL: ADL ADL Comments: see functional navigator  See Function Navigator for Current Functional Status.   Therapy/Group: Individual Therapy  [name] 04/16/2016, 3:31 PM

## 2016-04-17 ENCOUNTER — Inpatient Hospital Stay (HOSPITAL_COMMUNITY): Payer: MEDICAID | Admitting: Occupational Therapy

## 2016-04-17 ENCOUNTER — Inpatient Hospital Stay (HOSPITAL_COMMUNITY): Payer: MEDICAID | Admitting: Physical Therapy

## 2016-04-17 ENCOUNTER — Inpatient Hospital Stay (HOSPITAL_COMMUNITY): Payer: MEDICAID | Admitting: Speech Pathology

## 2016-04-17 ENCOUNTER — Inpatient Hospital Stay (HOSPITAL_COMMUNITY): Payer: Self-pay | Admitting: Occupational Therapy

## 2016-04-17 MED ORDER — TOPIRAMATE 25 MG PO TABS
25.0000 mg | ORAL_TABLET | Freq: Every day | ORAL | Status: DC
  Administered 2016-04-17 – 2016-04-20 (×4): 25 mg via ORAL
  Filled 2016-04-17 (×4): qty 1

## 2016-04-17 NOTE — Progress Notes (Signed)
Social Work  Social Work Assessment and Plan  Patient Details  Name: Victor Calhoun MRN: [id number] Date of Birth: 08/30/1956  Today's Date: 04/17/2016  Problem List:  Patient Active Problem List   Diagnosis Date Noted  . CVA (cerebral vascular accident) ([place]) 04/15/2016  . Hemiparesis ([place])   . Other vascular headache   . Benign essential HTN   . HLD (hyperlipidemia)   . Tobacco abuse   . Acute CVA (cerebrovascular accident) ([place]) 04/14/2016  . TIA (transient ischemic attack) 04/13/2016   Past Medical History:  Past Medical History  Diagnosis Date  . Medical history non-contributory    Past Surgical History:  Past Surgical History  Procedure Laterality Date  . No past surgeries     Social History:  reports that he has been smoking.  He does not have any smokeless tobacco history on file. He reports that he drinks alcohol. He reports that he does not use illicit drugs.  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 He has his girlfriend and many friends, he feels someone will be checking on him at discharge and he wants to be independent so he doesn't need this anyway.  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 He has been very tired from his therapies so he tries to rest when not in therapies. He wants to get back to work and out of the hospital. Recent Psychosocial Issues: healthy prior to admission-did not go to a MD though Pyschiatric History: No history appears to be doing well and coping appropriately. Will monitor and see if would benefit from neuro-psych seeing while here. Should be a short length of stay due to high level Substance Abuse History: Tobacco and Beer , he feels it is not an issue and will try to cut back on both.  Patient / Family Perceptions, Expectations & Goals Pt/Family understanding of illness & functional limitations: Pt and girlfriend are able to explain his stroke and how it affected him. Had MD speak with pt with an RN who speaks Spanish to answer his questions or address his concerns. Both seem to have a basic understanding of his condition and treatment plan. Premorbid pt/family roles/activities: Father, Boyfriend, employee, friend, etc Anticipated changes in roles/activities/participation: resume Pt/family expectations/goals: Pt states: " I want to do for myself by the time I leave here."  Girlfriend states: " I hope he continues to improve."  [employer]: None Premorbid Home Care/DME Agencies: None Transportation available at discharge: Girlfriend and friends Resource referrals recommended: Support group (  specify)  Discharge Planning Living Arrangements: Non-relatives/Friends Support Systems:  Spouse/significant other, Friends/neighbors, Other relatives Type of Residence: Private residence Insurance Resources: Teacher, adult education Resources: Employment Financial Screen Referred: Yes Living Expenses: Rent Money Management: Patient Does the patient have any problems obtaining your medications?: Yes (Describe) (Doesn't see a MD) Home Management: Pateint and roommate Patient/Family Preliminary Plans: Return home with girlfriend assisting if needed, but she will be limited due to her upcoming surgery. He has friends and extended family to check in on him. He should be mod/i due to high level, so should do well here. Will await team conference and try to get connected to PCP prior to discharge. Social Work Anticipated Follow Up Needs: HH/OP, Other (comment) (Needs PCP)  Clinical Impression Pleasant gentleman who is making good progress from his stroke. He has supportive girlfriend and friends who will check in on him. Will give information on the [facility] in [place] for medical follow up. Will make sure an interpreter is here for pt's therapy sessions. Work toward discharge next week.  Elease Hashimoto 04/17/2016, 11:54 AM

## 2016-04-17 NOTE — Progress Notes (Signed)
Physical Therapy Session Note  Patient Details  Name: Victor Calhoun MRN: [id number] Date of Birth: February 25, 1956  Today's Date: 04/17/2016 PT Individual Time: 0900-1000 and 1400-1445 PT Individual Time Calculation (min): 60 min and 45 min (total 105 min)   Short Term Goals: Week 1:  PT Short Term Goal 1 (Week 1): =LTG due to estimated LOS  Skilled Therapeutic Interventions/Progress Updates:    Tx 1: Pt received seated on EOB with breakfast tray contents scattered on floor; reports the tray slid open and everything fell off. Denies pain and agreeable to treatment. Gait to/from gym and in unit with S 2x300'. Dynavision on mode A/B, B with 1 digit numbers in t-scope. Note significant L inattention, missed 95% of lights on L side in mode B, reaction time of >4 sec on L side in mode A, compared to <1.5 sec on R. Unable to multi-task saying numbers on t-scope with lights unless given max cues. Seated and supine LE strengthening exercises with 1.5# weights on ankles. Plan to give pt handouts with spanish translated instructions. 3 sets 15 reps of all exercises. Returned to room with S; remained seated in recliner with all needs in reach; instruction to call for assistance before getting up and pt verbalizes understanding.   Tx 2: Pt received seated on EOB with handoff from SLP; denies pain and agreeable to treatment. Gait to/from gym with min verbal cues for navigation to locate gym/room. Nustep x10 min with BUE/BLE on level 6 for strengthening and aerobic endurance. Biodex catching game and limits of stability with stable and unstable platform. Max cues for weight shifting, alignment and use of ankle ROM to achieve weight shifts. Returned to room with S; remained seated in recliner with all needs in reach. Educated pt on importance of using call bell for assistance before getting up, and pt agreeable. Discussed with RN and recommended that if pt continues to get up use quick release belt.   Therapy  Documentation Precautions:  Precautions Precautions: Fall Precaution Comments: decr body awareness and decr sensation Restrictions Weight Bearing Restrictions: No Pain: Pain Assessment Pain Assessment: No/denies pain Pain Score: 4  Pain Type: Acute pain Pain Location: Head Pain Descriptors / Indicators: Aching Pain Frequency: Intermittent Pain Onset: Gradual Patients Stated Pain Goal: 2 Pain Intervention(s): Medication (See eMAR) Multiple Pain Sites: No   See Function Navigator for Current Functional Status.   Therapy/Group: Individual Therapy  Luberta Mutter 04/17/2016, 3:55 PM

## 2016-04-17 NOTE — Progress Notes (Signed)
60 year old right-handed non-English speaking male from [place] with no significant medical history except tobacco abuse. By chart review patient lives alone but does have a roommate currently working in [employer] and cannot assist. He does have a girlfriend that can assist but is awaiting a surgery. One level apartment with level entry. 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 Initial cranial CT scan negative. MRI of the brain showed diffusion abnormality right thalamus and throughout right hippocampal formation. 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 Severe stenosis distal right vertebral artery  Subjective/Complaints: Discussed with pt's  RN who is Spanish speaking, only c/o is HA ROS- diificult to assess due to language   Objective: Vital Signs: Blood pressure 109/70, pulse 78, temperature 97.6 F (36.4 C), temperature source Oral, resp. rate 18, height 5' 6"  (1.676 m), weight 71.714 kg (158 lb 1.6 oz), SpO2 96 %. No results found. Results for orders placed or performed during the hospital encounter of 04/15/16 (from the past 72 hour(s))  CBC     Status: None   Collection Time: 04/15/16  6:56 PM  Result Value Ref Range   WBC 7.3 4.0 - 10.5 K/uL   RBC 5.38 4.22 - 5.81 MIL/uL   Hemoglobin 16.6 13.0 - 17.0 g/dL   HCT 48.5 39.0 - 52.0 %   MCV 90.1 78.0 - 100.0 fL   MCH 30.9 26.0 - 34.0 pg   MCHC 34.2 30.0 - 36.0 g/dL   RDW 12.8 11.5 - 15.5 %   Platelets 215 150 - 400 K/uL  Creatinine, serum     Status: None   Collection Time: 04/15/16  6:56 PM  Result Value Ref Range   Creatinine, Ser 1.16 0.61 - 1.24 mg/dL   GFR calc non Af Amer >60 >60 mL/min   GFR calc Af Amer >60 >60 mL/min    Comment: (NOTE) The  eGFR has been calculated using the CKD EPI equation. This calculation has not been validated in all clinical situations. eGFR's persistently <60 mL/min signify possible Chronic Kidney Disease.   CBC WITH DIFFERENTIAL     Status: None   Collection Time: 04/16/16  5:44 AM  Result Value Ref Range   WBC 6.4 4.0 - 10.5 K/uL   RBC 5.34 4.22 - 5.81 MIL/uL   Hemoglobin 16.2 13.0 - 17.0 g/dL   HCT 48.3 39.0 - 52.0 %   MCV 90.4 78.0 - 100.0 fL   MCH 30.3 26.0 - 34.0 pg   MCHC 33.5 30.0 - 36.0 g/dL   RDW 12.8 11.5 - 15.5 %   Platelets 197 150 - 400 K/uL   Neutrophils Relative % 60 %   Neutro Abs 3.8 1.7 - 7.7 K/uL   Lymphocytes Relative 22 %   Lymphs Abs 1.4 0.7 - 4.0 K/uL   Monocytes Relative 11 %   Monocytes Absolute 0.7 0.1 - 1.0 K/uL   Eosinophils Relative 8 %   Eosinophils Absolute 0.5 0.0 - 0.7 K/uL   Basophils Relative 1 %   Basophils Absolute 0.0 0.0 - 0.1 K/uL  Comprehensive metabolic panel     Status: Abnormal   Collection Time: 04/16/16  5:44 AM  Result Value Ref Range   Sodium 141 135 - 145 mmol/L   Potassium 4.1 3.5 - 5.1 mmol/L   Chloride 104  101 - 111 mmol/L   CO2 25 22 - 32 mmol/L   Glucose, Bld 112 (H) 65 - 99 mg/dL   BUN 19 6 - 20 mg/dL   Creatinine, Ser 1.22 0.61 - 1.24 mg/dL   Calcium 9.1 8.9 - 10.3 mg/dL   Total Protein 7.0 6.5 - 8.1 g/dL   Albumin 3.6 3.5 - 5.0 g/dL   AST 23 15 - 41 U/L   ALT 22 17 - 63 U/L   Alkaline Phosphatase 71 38 - 126 U/L   Total Bilirubin 1.2 0.3 - 1.2 mg/dL   GFR calc non Af Amer >60 >60 mL/min   GFR calc Af Amer >60 >60 mL/min    Comment: (NOTE) The eGFR has been calculated using the CKD EPI equation. This calculation has not been validated in all clinical situations. eGFR's persistently <60 mL/min signify possible Chronic Kidney Disease.    Anion gap 12 5 - 15     HEENT: poor dentition Cardio: RRR and no murmur Resp: CTA B/L and unlabored GI: BS positive and NT, ND Extremity:  Pulses positive and No Edema Skin:   Other  vitiligo Neuro: Alert/Oriented, Abnormal Sensory reduced in LUE and LLE, Abnormal Motor 4/5 in LUE , 5/5 in RUE and in LEs, Abnormal FMC Ataxic/ dec FMC and Dysarthric Musc/Skel:  Other no pain with UE or LE ROM Gen NAD   Assessment/Plan: 1. Functional deficits secondary to  right thalamus and right hippocampus infarct, Left hemisensory deficits with ataxia which require 3+ hours per day of interdisciplinary therapy in a comprehensive inpatient rehab setting. Physiatrist is providing close team supervision and 24 hour management of active medical problems listed below. Physiatrist and rehab team continue to assess barriers to discharge/monitor patient progress toward functional and medical goals. FIM: Function - Bathing Position: Shower Body parts bathed by patient: Right arm, Left arm, Chest, Abdomen, Front perineal area, Buttocks, Right upper leg, Left upper leg, Right lower leg, Left lower leg Body parts bathed by helper: Back Assist Level: Touching or steadying assistance(Pt > 75%)  Function- Upper Body Dressing/Undressing What is the patient wearing?: Pull over shirt/dress Pull over shirt/dress - Perfomed by patient: Thread/unthread right sleeve, Thread/unthread left sleeve, Put head through opening, Pull shirt over trunk Function - Lower Body Dressing/Undressing What is the patient wearing?: Pants, Shoes Position: Sitting EOB Underwear - Performed by patient: Thread/unthread right underwear leg, Thread/unthread left underwear leg, Pull underwear up/down Pants- Performed by patient: Thread/unthread right pants leg, Thread/unthread left pants leg, Pull pants up/down, Fasten/unfasten pants Non-skid slipper socks- Performed by patient: Don/doff right sock, Don/doff left sock (doff) Shoes - Performed by patient: Don/doff right shoe, Don/doff left shoe (cowboy boots) TED Hose - Performed by helper: Don/doff right TED hose, Don/doff left TED hose Assist for footwear: Setup Assist for  lower body dressing: Touching or steadying assistance (Pt > 75%)  Function - Toileting Toileting activity did not occur: No continent bowel/bladder event Toileting steps completed by patient: Adjust clothing prior to toileting, Performs perineal hygiene, Adjust clothing after toileting Toileting Assistive Devices: Grab bar or rail Assist level: More than reasonable time  Function Midwife transfer assistive device: Grab bar Assist level to toilet: Supervision or verbal cues Assist level from toilet: Supervision or verbal cues  Function - Chair/bed transfer Chair/bed transfer method: Ambulatory Chair/bed transfer assist level: Supervision or verbal cues Chair/bed transfer details: Verbal cues for precautions/safety  Function - Locomotion: Wheelchair Will patient use wheelchair at discharge?: No Function - Locomotion: Ambulation  Assistive device: No device Max distance: 300 Assist level: Supervision or verbal cues Assist level: Supervision or verbal cues Assist level: Supervision or verbal cues Assist level: Supervision or verbal cues Assist level: Supervision or verbal cues  Function - Comprehension Comprehension: Auditory Comprehension assist level: Understands complex 90% of the time/cues 10% of the time  Function - Expression Expression: Verbal Expression assist level: Expresses basic needs/ideas: With no assist  Function - Social Interaction Social Interaction assist level: Interacts appropriately 90% of the time - Needs monitoring or encouragement for participation or interaction.  Function - Problem Solving Problem solving assist level: Solves basic problems with no assist  Function - Memory Memory assist level: Recognizes or recalls 90% of the time/requires cueing < 10% of the time Patient normally able to recall (first 3 days only): Current season, Location of own room, Staff names and faces, That he or she is in a hospital   Medical Problem  List and Plan: 1.  Left side weakness and dizziness secondary to right thalamus and right hippocampus infarct, R PCA occlusion- Intitating rehab program 2.  DVT Prophylaxis/Anticoagulation: Subcutaneous Lovenox. Monitor platelet counts of any signs of bleeding 3. Pain Management: Fioricet for vascular headaches, will start topamax 23m daily may titrate to BID after 3 days 4. Hypertension. Lopressor 25 mg twice a day, BP well controlled 5. Neuropsych: This patient is capable of making decisions on his own behalf. 6. Skin/Wound Care: Routine skin checks 7. Fluids/Electrolytes/Nutrition: Routine I&O with follow-up chemistries 8. Hyperlipidemia. Lipitor 9. Tobacco abuse. Counseling  LOS (Days) 2 A FACE TO FACE EVALUATION WAS PERFORMED  KIRSTEINS,ANDREW E 04/17/2016, 7:44 AM

## 2016-04-17 NOTE — Progress Notes (Signed)
Occupational Therapy Session Note  Patient Details  Name: Victor Calhoun MRN: [id number] Date of Birth: 1956/06/27  Today's Date: 04/17/2016 OT Individual Time: [id number] OT Individual Time Calculation (min): 45 min    Short Term Goals: Week 1:  OT Short Term Goal 1 (Week 1): Pt will not bump into objects in left environment in a 60 min session with min questioning cues  OT Short Term Goal 2 (Week 1): Pt will perform shower standing with supervision  OT Short Term Goal 3 (Week 1): Pt will use bilateral UE in 3/3 grooming tasks at sink without cuing  OT Short Term Goal 4 (Week 1): Pt will perform grooming tasks at sink mod I   Skilled Therapeutic Interventions/Progress Updates:    Treatment session with focus on Lt attention during self-care tasks and BUE functional use.  Pt ambulated to sink without assist.  Engaged in oral care in standing at sink with use of BUE with brushing teeth and cleaning dentures without cues to incorporate LUE into task.  Bathing completed at sit >stand level in room shower without assist and dressing completed in standing with UE support on grab bar.  Pt with 1 LOB but able to correct without assist.  Returned to bed at end of session.  Therapy Documentation Precautions:  Precautions Precautions: Fall Precaution Comments: decr body awareness and decr sensation Restrictions Weight Bearing Restrictions: No General:   Vital Signs: Therapy Vitals Temp: 98.3 F (36.8 C) Temp Source: Oral Pulse Rate: 87 Resp: 18 BP: 126/72 mmHg Patient Position (if appropriate): Sitting Oxygen Therapy SpO2: 97 % O2 Device: Not Delivered Pain: Pain Assessment Pain Assessment: No/denies pain ADL: ADL ADL Comments: see functional navigator  See Function Navigator for Current Functional Status.   Therapy/Group: Individual Therapy  [name] 04/17/2016, 3:14 PM

## 2016-04-17 NOTE — Evaluation (Signed)
Speech Language Pathology Assessment and Plan  Patient Details  Name: Victor Calhoun MRN: [id number] Date of Birth: [birth year]  SLP Diagnosis: Cognitive Impairments  Rehab Potential: Good ELOS: 7 days    Today's Date: 04/17/2016 SLP Individual Time: 1300-1400 SLP Individual Time Calculation (min): 60 min   Problem List:  Patient Active Problem List   Diagnosis Date Noted  . CVA (cerebral vascular accident) ([place]) 04/15/2016  . Hemiparesis ([place])   . Other vascular headache   . Benign essential HTN   . HLD (hyperlipidemia)   . Tobacco abuse   . Acute CVA (cerebrovascular accident) ([place]) 04/14/2016  . TIA (transient ischemic attack) 04/13/2016   Past Medical History:  Past Medical History  Diagnosis Date  . Medical history non-contributory    Past Surgical History:  Past Surgical History  Procedure Laterality Date  . No past surgeries      Assessment / Plan / Recommendation Clinical Impression   HPI: Victor Calhoun with no significant medical history except tobacco abuse. By chart review patient lives alone but does have a roommate currently working in [employer] and cannot assist. He does have a girlfriend that can assist but is awaiting a surgery. One level apartment with level entry. 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 Initial cranial CT scan negative. MRI of the brain showed diffusion abnormality right thalamus and throughout right hippocampal formation. 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   Pt participated with speech/language/cognitive assessment due to concern for cognitive limitations with functional activities. Speech and language function were WNL. Pt demonstrated deficits with vision (seeing part  of the whole, consistently attending to left upper quadrant last), attention, and awareness. Pt would benefit from skilled SLP intervention to maximize independence, however anticipate pt will require min A with high level cognitive tasks such as bill paying and medication management even upon discharge from CIR.  Skilled Therapeutic Interventions          Attempted completion of the Cognitive-Linguistic Quick Test, but was unable to complete due to pt difficulty with tasks. 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 Pt was able to identify that had difficulty with the story recall portion of the assessment, but was unaware of his visual limitations. Pt only endorsed physical deficits as a result of the stroke. Pt responded well to visual cueing, verbal explanations and increased time to compensate for limitations. Decreasing environmental distractions was also beneficial to improve pt's attention to tasks.   SLP Assessment  Patient will need skilled [facility] during CIR admission    Recommendations  Patient destination: Home Follow up Recommendations: Outpatient SLP (pending progress) Equipment Recommended: None recommended by SLP    SLP Frequency 3 to 5 out of 7 days   SLP Duration  SLP Intensity  SLP Treatment/Interventions 7 days  Minumum of 1-2 x/day, 30 to 90 minutes  Cognitive remediation/compensation;Multimodal communication approach;Cueing hierarchy;Functional tasks;Therapeutic Activities;Patient/family education;Medication managment    Pain Pain Assessment Pain Assessment: No/denies pain  Prior Functioning Cognitive/Linguistic Baseline: Information not available Type of Home: Apartment Available Help at Discharge: Available PRN/intermittently  Function:  Eating Eating                 Cognition Comprehension Comprehension assist level: Understands basic 90%  of  the time/cues < 10% of the time  Expression   Expression assist level: Expresses basic needs/ideas: With no assist  Social Interaction Social Interaction assist level: Interacts appropriately 75 - 89% of the time - Needs redirection for appropriate language or to initiate interaction.  Problem Solving Problem solving assist level: Solves basic 90% of the time/requires cueing < 10% of the time  Memory Memory assist level: Recognizes or recalls 50 - 74% of the time/requires cueing 25 - 49% of the time   Short Term Goals: Week 1: SLP Short Term Goal 1 (Week 1): Pt attend to L during functional visual tasks (pen and paper or otherwise) at mod I.  SLP Short Term Goal 2 (Week 1): Pt demonstrate selective attention to activity for 10 minutes with min A. SLP Short Term Goal 3 (Week 1): Pt to demonstrate recall of new information (auditory or visually presented) with min A semantic cueing. SLP Short Term Goal 4 (Week 1): Pt demonstrate reading comprehension at the sentence level at mod I.  Refer to Care Plan for Long Term Goals  Recommendations for other services: None  Discharge Criteria: Patient will be discharged from SLP if patient refuses treatment 3 consecutive times without medical reason, if treatment goals not met, if there is a change in medical status, if patient makes no progress towards goals or if patient is discharged from hospital.  The above assessment, treatment plan, treatment alternatives and goals were discussed and mutually agreed upon: by patient  [name] MA, CCC-SLP 04/17/2016, 2:35 PM

## 2016-04-17 NOTE — IPOC Note (Signed)
Overall Plan of Care [employer]) Patient Details Name: Victor Calhoun MRN: [id number] DOB: 1956/03/03  Admitting Diagnosis: R CVA  Hospital Problems: Active Problems:   Acute CVA (cerebrovascular accident) ([place])   Hemiparesis ([place])   Other vascular headache   Benign essential HTN   HLD (hyperlipidemia)   Tobacco abuse   CVA (cerebral vascular accident) ([place])     Functional Problem List: Nursing Endurance, Medication Management, Motor, Pain, Perception, Safety, Sensory, Skin Integrity  PT Balance, Endurance, Motor, Safety  OT Balance, Cognition, Endurance, Motor, Perception, Pain, Safety, Sensory  SLP    TR         Basic ADL's: OT Grooming, Bathing, Dressing, Toileting     Advanced  ADL's: OT Simple Meal Preparation     Transfers: PT Bed Mobility, Bed to Chair, Car, [employer], Futures trader, Metallurgist: PT Ambulation, Stairs     Additional Impairments: OT Fuctional Use of Upper Extremity  SLP        TR      Anticipated Outcomes Item Anticipated Outcome  Self Feeding n/a  Swallowing      Basic self-care  mod I   Toileting  mod I    Bathroom Transfers mod I   Bowel/Bladder  Pt will manage Bowel and bladder Mod I  Transfers  mod I  Locomotion  mod I in home, S stairs and community gait  Communication     Cognition     Pain  Pain less than 3  Safety/Judgment  Patient to remain safe, free from falls and skin breakdown, infection while on rehab   Therapy Plan: PT Intensity: Minimum of 1-2 x/day ,45 to 90 minutes PT Frequency: 5 out of 7 days PT Duration Estimated Length of Stay: 6-9 days OT Intensity: Minimum of 1-2 x/day, 45 to 90 minutes OT Frequency: 5 out of 7 days OT Duration/Estimated Length of Stay: 7-9 days         Team Interventions: Nursing Interventions Patient/Family Education, Medication Management, Pain Management, Skin Care/Wound Management, Cognitive Remediation/Compensation, Discharge Planning, Psychosocial  Support  PT interventions Ambulation/gait training, Training and development officer, Disease management/prevention, Community reintegration, Discharge planning, Neuromuscular re-education, Functional mobility training, Psychosocial support, Patient/family education, Therapeutic Activities, Stair training, UE/LE Strength taining/ROM, Therapeutic Exercise, UE/LE Coordination activities  OT Interventions Training and development officer, Cognitive remediation/compensation, Community reintegration, Discharge planning, DME/adaptive equipment instruction, Disease mangement/prevention, Functional mobility training, Functional electrical stimulation, Neuromuscular re-education, Patient/family education, Self Care/advanced ADL retraining, Therapeutic Exercise, UE/LE Coordination activities, Visual/perceptual remediation/compensation, UE/LE Strength taining/ROM, Therapeutic Activities, Skin care/wound managment, Pain management, Psychosocial support  SLP Interventions    TR Interventions    SW/CM Interventions Discharge Planning, Psychosocial Support, Patient/Family Education    Team Discharge Planning: Destination: PT-Home ,OT- Home , SLP-  Projected Follow-up: PT-None, OT-  Outpatient OT, SLP-  Projected Equipment Needs: PT-None recommended by PT, OT- To be determined, SLP-  Equipment Details: PT- , OT-  Patient/family involved in discharge planning: PT- Patient,  OT-Patient, SLP-   MD ELOS: 7-8 days Medical Rehab Prognosis:  Excellent Assessment: The patient has been admitted for CIR therapies with the diagnosis of right CVA. The team will be addressing functional mobility, strength, stamina, balance, safety, adaptive techniques and equipment, self-care, bowel and bladder mgt, patient and caregiver education, NMR, visual-spatial awareness, ego support, community reintegration . Goals have been set at mod I for mobility and self-care/ADL's. Meredith Staggers, MD, FAAPMR      See Team Conference Notes  for  weekly updates to the plan of care

## 2016-04-18 ENCOUNTER — Inpatient Hospital Stay (HOSPITAL_COMMUNITY): Payer: MEDICAID | Admitting: Occupational Therapy

## 2016-04-18 ENCOUNTER — Inpatient Hospital Stay (HOSPITAL_COMMUNITY): Payer: Self-pay | Admitting: Physical Therapy

## 2016-04-18 ENCOUNTER — Inpatient Hospital Stay (HOSPITAL_COMMUNITY): Payer: MEDICAID | Admitting: Speech Pathology

## 2016-04-18 NOTE — Progress Notes (Signed)
Occupational Therapy Session Note  Patient Details  Name: Victor Calhoun MRN: [id number] Date of Birth: 05-06-1956  Today's Date: 04/18/2016 OT Individual Time: 1205-1305 OT Individual Time Calculation (min): 60 min    Short Term Goals: Week 1:  OT Short Term Goal 1 (Week 1): Pt will not bump into objects in left environment in a 60 min session with min questioning cues  OT Short Term Goal 2 (Week 1): Pt will perform shower standing with supervision  OT Short Term Goal 3 (Week 1): Pt will use bilateral UE in 3/3 grooming tasks at sink without cuing  OT Short Term Goal 4 (Week 1): Pt will perform grooming tasks at sink mod I   Skilled Therapeutic Interventions/Progress Updates: patient lunch had not arrived so he agreed to participate in OT a little early.   He participated as follows:  PNF diagonals 1 and 2 with his affected L UE (he was standing without loss of balance next to his bed participating in this activity)  He c/o pain in left lats and triceps when he flexed his left shoulder past 85 degrees or with left internal rotation when his shoulder was flexed past 85 degrees  AROM, strengthening, and neuro reducation to left UE seated and standing  Visual focus and left to right attention and scanning  Toilet transfer (ambulated room to/fr standing at toilet) with distant supervision  Toileting= Supervision     Therapy Documentation Precautions:  Precautions Precautions: Fall Precaution Comments: decr body awareness and decr sensation Restrictions Weight Bearing Restrictions: No   Pain:denied    ADL ADL Comments: see functional navigator See Function Navigator for Current Functional Status.   Therapy/Group: Individual Therapy  Herschell Dimes 04/18/2016, 2:26 PM

## 2016-04-18 NOTE — Progress Notes (Signed)
Victor Calhoun is a 60 y.o. male [birth year] [id number]  Subjective: No new complaints. No new problems. Slept well. Feeling OK.  Objective: Vital signs in last 24 hours: Temp:  [97.4 F (36.3 C)-98.3 F (36.8 C)] 97.4 F (36.3 C) (04/22 0504) Pulse Rate:  [70-88] 70 (04/22 0504) Resp:  [18] 18 (04/22 0504) BP: (115-126)/(72-75) 115/74 mmHg (04/22 0504) SpO2:  [97 %] 97 % (04/22 0504) Weight change:  Last BM Date: 04/16/16  Intake/Output from previous day: 04/21 0701 - 04/22 0700 In: 720 [P.O.:720] Out: -  Last cbgs: CBG (last 3)  No results for input(s): GLUCAP in the last 72 hours.   Physical Exam General: No apparent distress  Eating breakfast HEENT: not dry Lungs: Normal effort. Lungs clear to auscultation, no crackles or wheezes. Cardiovascular: Regular rate and rhythm, no edema Abdomen: S/NT/ND; BS(+) Musculoskeletal:  unchanged Neurological: No new neurological deficits Wounds: N/A    Skin: clear   Mental state: Alert, cooperative    Lab Results: BMET    Component Value Date/Time   NA 141 04/16/2016 0544   K 4.1 04/16/2016 0544   CL 104 04/16/2016 0544   CO2 25 04/16/2016 0544   GLUCOSE 112* 04/16/2016 0544   BUN 19 04/16/2016 0544   CREATININE 1.22 04/16/2016 0544   CALCIUM 9.1 04/16/2016 0544   GFRNONAA >60 04/16/2016 0544   GFRAA >60 04/16/2016 0544   CBC    Component Value Date/Time   WBC 6.4 04/16/2016 0544   RBC 5.34 04/16/2016 0544   HGB 16.2 04/16/2016 0544   HCT 48.3 04/16/2016 0544   PLT 197 04/16/2016 0544   MCV 90.4 04/16/2016 0544   MCH 30.3 04/16/2016 0544   MCHC 33.5 04/16/2016 0544   RDW 12.8 04/16/2016 0544   LYMPHSABS 1.4 04/16/2016 0544   MONOABS 0.7 04/16/2016 0544   EOSABS 0.5 04/16/2016 0544   BASOSABS 0.0 04/16/2016 0544    Studies/Results: No results found.  Medications: I have reviewed the patient's current medications.  Assessment/Plan:  1. Left side weakness and dizziness secondary to right thalamus  and right hippocampus infarct, R PCA occlusion- Intitating rehab program 2. DVT Prophylaxis/Anticoagulation: Subcutaneous Lovenox. Monitor platelet counts of any signs of bleeding 3. Pain Management: Fioricet for vascular headaches, started on topamax 25mg  daily may titrate to BID 4. Hypertension. Lopressor 25 mg twice a day, BP well controlled 5. Neuropsych: This patient is capable of making decisions on his own behalf. 6. Skin/Wound Care: Routine skin checks 7. Fluids/Electrolytes/Nutrition: Routine I&O with follow-up chemistries 8. Hyperlipidemia. Lipitor 9. Tobacco abuse. Counseling    Length of stay, days: 3  Walker Kehr , MD 04/18/2016, 10:22 AM

## 2016-04-18 NOTE — Progress Notes (Signed)
Occupational Therapy Session Note  Patient Details  Name: Victor Calhoun MRN: [id number] Date of Birth: May 08, 1956  Today's Date: 04/18/2016 OT Individual Time: 1306-1406 OT Individual Time Calculation (min): 60 min    Short Term Goals: Week 1:  OT Short Term Goal 1 (Week 1): Pt will not bump into objects in left environment in a 60 min session with min questioning cues  OT Short Term Goal 2 (Week 1): Pt will perform shower standing with supervision  OT Short Term Goal 3 (Week 1): Pt will use bilateral UE in 3/3 grooming tasks at sink without cuing  OT Short Term Goal 4 (Week 1): Pt will perform grooming tasks at sink mod I   Skilled Therapeutic Interventions/Progress Updates: patient participated in OT session as follows:     Static standing to complete right UE neuro reducation while maintaining balance [email or web address] (left directionality, saccades and attention).   Patient required Min anchoring cueing initially to attend left and then to saccade and was able to do so dependently after 3 initial short attempts  Visual closure on [email or web address] with moderate difficulty.  Environmental left attention identifying objects in his left environment as he ambulated with supervision (without assistive device) up and down hall to/fr small Brain Injury Gym Was educated on sensation inducing actitivies along his left UE (girl friend was educated also) Patient was able to dig out small beads,paper clip and pennies with left digits with extra time Patient was educated on theraputty exercises for strength, ROM and sensation (girl friend was educated as well)  Girlfriend stated Mr. Deago Escobedo called her around 1 am and said he was having difficulty sleeping and asked if staff will monitor and offer him a sleeping aide.  This clinician shared that info with his nurse.     Therapy Documentation Precautions:  Precautions Precautions: Fall Precaution Comments: decr body awareness and decr  sensation Restrictions Weight Bearing Restrictions: No   Pain:8/10 in right eye and on crown of head.    Contacted RN & she gave meds   See Function Navigator for Current Functional Status.   Therapy/Group: Individual Therapy  Alfredia Ferguson [facility] 04/18/2016, 4:28 PM

## 2016-04-18 NOTE — Progress Notes (Signed)
Speech Language Pathology Daily Session Note  Patient Details  Name: Victor Calhoun MRN: [id number] Date of Birth: [birth year]  Today's Date: 04/18/2016 SLP Individual Time: [id number] SLP Individual Time Calculation (min): 30 min  Short Term Goals: Week 1: SLP Short Term Goal 1 (Week 1): Pt attend to L during functional visual tasks (pen and paper or otherwise) at mod I.  SLP Short Term Goal 2 (Week 1): Pt demonstrate selective attention to activity for 10 minutes with min A. SLP Short Term Goal 3 (Week 1): Pt to demonstrate recall of new information (auditory or visually presented) with min A semantic cueing. SLP Short Term Goal 4 (Week 1): Pt demonstrate reading comprehension at the sentence level at mod I.  Skilled Therapeutic Interventions: Skilled treatment session focused on cognitive goals. SLP facilitated session by providing extra time and Min-Mod A verbal and visual cues for visual scanning in a moderately distracting visual field. Patient also required supervision-Min A verbal cues to demonstrate selective attention to task for ~10 minute increments. Patient left upright on EOB with family present. Continue with current plan of care.    Function:  Cognition Comprehension Comprehension assist level: Understands complex 90% of the time/cues 10% of the time  Expression   Expression assist level: Expresses basic needs/ideas: With no assist  Social Interaction Social Interaction assist level: Interacts appropriately 75 - 89% of the time - Needs redirection for appropriate language or to initiate interaction.  Problem Solving Problem solving assist level: Solves basic 90% of the time/requires cueing < 10% of the time  Memory Memory assist level: Recognizes or recalls 50 - 74% of the time/requires cueing 25 - 49% of the time    Pain No/Denies Pain   Therapy/Group: Individual Therapy  [name] 04/18/2016, 3:42 PM

## 2016-04-18 NOTE — Progress Notes (Signed)
Physical Therapy Session Note  Patient Details  Name: Victor Calhoun MRN: [id number] Date of Birth: February 16, 1956  Today's Date: 04/18/2016 PT Individual Time: 1430-1515 PT Individual Time Calculation (min): 45 min   Short Term Goals: Week 1:  PT Short Term Goal 1 (Week 1): =LTG due to estimated LOS  Skilled Therapeutic Interventions/Progress Updates:    Patient received sitting EOB with interpreter present for treatment. Patient instructed in gait training for 164f, 2074f 30052fand 75 ft on level surface with supervision A and no AD. Gait training on unlevel surface including grass and unlevel sidewalk for [id number] no AD, supervision A from PT, Patient noted to have 1 LOB while walking in grass and able to self correct with supervision from PT.   Patient instructed in balance training, standing on airex: Alternating toe taps to 6 inch step x 12 BLE  Toe taps to various targets 2x 12 BLE  PT was required to provide min A for all balance training on airex with tactile and verbal cues for improved weight shift and increased stability in the LLE. Increased difficulty noted with toe taps to target with LLE due to poor body awareness.  Patient returned to room and left sitting EOB with all needs met.   Therapy Documentation Precautions:  Precautions Precautions: Fall Precaution Comments: decr body awareness and decr sensation Restrictions Weight Bearing Restrictions: No    Pain: Pain Assessment Pain Assessment: No/denies pain Pain Score: 0-No pain  See Function Navigator for Current Functional Status.   Therapy/Group: Individual Therapy  AustLorie Phenix2/2017, 6:37 PM

## 2016-04-19 ENCOUNTER — Inpatient Hospital Stay (HOSPITAL_COMMUNITY): Payer: MEDICAID | Admitting: Physical Therapy

## 2016-04-19 NOTE — Progress Notes (Signed)
Victor Calhoun is a 60 y.o. male 02-22-1956 [id number]  Subjective: No complaints. No new problems. Slept well.  Objective: Vital signs in last 24 hours: Temp:  [97.9 F (36.6 C)] 97.9 F (36.6 C) (04/23 0438) Pulse Rate:  [76] 76 (04/23 0438) Resp:  [18] 18 (04/23 0438) BP: (122)/(74) 122/74 mmHg (04/23 0438) SpO2:  [97 %] 97 % (04/23 0438) Weight change:  Last BM Date: 04/16/16  Intake/Output from previous day: 04/22 0701 - 04/23 0700 In: 240 [P.O.:240] Out: -  Last cbgs: CBG (last 3)  No results for input(s): GLUCAP in the last 72 hours.   Physical Exam General: NAD HEENT: not dry Lungs: Normal effort. Lungs clear to auscultation, no crackles or wheezes. Cardiovascular: Regular rate and rhythm, no edema Abdomen: S/NT/ND; BS(+) Musculoskeletal:  unchanged Neurological: No new neurological deficits Wounds: N/A    Skin: clear   Mental state: Alert, cooperative    Lab Results: BMET    Component Value Date/Time   NA 141 04/16/2016 0544   K 4.1 04/16/2016 0544   CL 104 04/16/2016 0544   CO2 25 04/16/2016 0544   GLUCOSE 112* 04/16/2016 0544   BUN 19 04/16/2016 0544   CREATININE 1.22 04/16/2016 0544   CALCIUM 9.1 04/16/2016 0544   GFRNONAA >60 04/16/2016 0544   GFRAA >60 04/16/2016 0544   CBC    Component Value Date/Time   WBC 6.4 04/16/2016 0544   RBC 5.34 04/16/2016 0544   HGB 16.2 04/16/2016 0544   HCT 48.3 04/16/2016 0544   PLT 197 04/16/2016 0544   MCV 90.4 04/16/2016 0544   MCH 30.3 04/16/2016 0544   MCHC 33.5 04/16/2016 0544   RDW 12.8 04/16/2016 0544   LYMPHSABS 1.4 04/16/2016 0544   MONOABS 0.7 04/16/2016 0544   EOSABS 0.5 04/16/2016 0544   BASOSABS 0.0 04/16/2016 0544    Studies/Results: No results found.  Medications: I have reviewed the patient's current medications.  Assessment/Plan:  1. Left side weakness and dizziness secondary to right thalamus and right hippocampus infarct, R PCA occlusion- Intitating rehab program 2.  DVT Prophylaxis/Anticoagulation: Subcutaneous Lovenox. Monitor platelet counts of any signs of bleeding 3. Pain Management: Fioricet for vascular headaches, started on topamax 25mg  daily may titrate to BID 4. Hypertension. Lopressor 25 mg twice a day, BP well controlled 5. Neuropsych: This patient is capable of making decisions on his own behalf. 6. Skin/Wound Care: Routine skin checks 7. Fluids/Electrolytes/Nutrition: Routine I&O with follow-up chemistries 8. Hyperlipidemia. Lipitor 9. Tobacco abuse. Counseling   Cont current Rx    Length of stay, days: [place] , MD 04/19/2016, 8:47 AM

## 2016-04-19 NOTE — Progress Notes (Signed)
Physical Therapy Session Note  Patient Details  Name: Victor Calhoun MRN: [id number] Date of Birth: 1956-03-23  Today's Date: 04/19/2016 PT Individual Time: [id number] PT Individual Time Calculation (min): 40 min   Short Term Goals: Week 1:  PT Short Term Goal 1 (Week 1): =LTG due to estimated LOS  Skilled Therapeutic Interventions/Progress Updates:    Pt received in recliner reporting 2/10 headache & agreeable to PT. No interpreter present during first part of session; RN reported pt has been able to communicate with her this morning & pt able to follow gestures.  RN notified of pt's headache & administered pain medication. Pt able to don shoes with independence then ambulate room>gym with mod I without AD. Utilized Diplomatic Services operational officer to challenges pt's balance & had pt stand on device with eyes open & closed with lateral & anterior/posterior challenges to balance. Pt did not experience any LOB during activity. Interpreter then arrived for session; discussed activities completed prior to interpreter's arrival and explained purpose of each one to patient. Pt played Wii Tennis while standing without BUE support and maximum cuing for how to play game. Pt able to tolerate standing for ~10 minutes without BUE support and LOB. Pt then ambulated back to room without AD & left in recliner with all needs within reach.      Therapy Documentation Precautions:  Precautions Precautions: Fall Precaution Comments: decr body awareness and decr sensation Restrictions Weight Bearing Restrictions: No   Pain: Pain Assessment Pain Assessment: 0-10 Pain Score: 2  Pain Type:  (head) Pain Location:  (ache) Pain Intervention(s): RN made aware   See Function Navigator for Current Functional Status.   Therapy/Group: Individual Therapy  [name] 04/19/2016, 1:13 PM

## 2016-04-20 ENCOUNTER — Inpatient Hospital Stay (HOSPITAL_COMMUNITY): Payer: Self-pay | Admitting: Speech Pathology

## 2016-04-20 ENCOUNTER — Inpatient Hospital Stay (HOSPITAL_COMMUNITY): Payer: MEDICAID | Admitting: Occupational Therapy

## 2016-04-20 ENCOUNTER — Inpatient Hospital Stay (HOSPITAL_COMMUNITY): Payer: MEDICAID | Admitting: Physical Therapy

## 2016-04-20 MED ORDER — TOPIRAMATE 25 MG PO TABS
25.0000 mg | ORAL_TABLET | Freq: Two times a day (BID) | ORAL | Status: DC
  Administered 2016-04-20 – 2016-04-24 (×8): 25 mg via ORAL
  Filled 2016-04-20 (×8): qty 1

## 2016-04-20 NOTE — Progress Notes (Signed)
Occupational Therapy Session Note  Patient Details  Name: Victor Calhoun MRN: [id number] Date of Birth: [birth year]  Today's Date: 04/20/2016 OT Individual Time: [id number] and [id number]  OT Individual Time Calculation (min): 60 min and 43 min   Short Term Goals: Week 1:  OT Short Term Goal 1 (Week 1): Pt will not bump into objects in left environment in a 60 min session with min questioning cues  OT Short Term Goal 2 (Week 1): Pt will perform shower standing with supervision  OT Short Term Goal 3 (Week 1): Pt will use bilateral UE in 3/3 grooming tasks at sink without cuing  OT Short Term Goal 4 (Week 1): Pt will perform grooming tasks at sink mod I   Skilled Therapeutic Interventions/Progress Updates:    1) Treatment session with focus on ADL retraining and cognitive dual tasks during self-care tasks.  Pt completed grooming tasks prior to bathing with mod verbal cues for sequencing with shaving task to utilize shaving cream as pt with heavy stubble.  Discussed safety with sharp objects and sustained attention to task at hand.  Bathing and dressing completed at sit >stand level with pt bathing in standing for approx 75% of time.  Discussed goal of standing for 100% of shower prior to d/c home.  Pt requires increased time for all self-care tasks due to decreased initiation and sequencing.  2) Treatment session with focus on LUE NMR and coordination.  Engaged in PNF pattern reaching in standing with focus on motor control, improved control when visually attending to LUE.  Increased challenge to completing PNF pattern reaching while holding 2# medicine ball.  Verbal cues to ensure Lt hand contact with ball as pt had a tendency to lose contact.  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  Engaged in reaching activity with crossing midline to retrieve items from high  and low, cues to visually attend to Lt arm to increase motor control.  Engaged in pyramid stacking of cups incorporating crossing midline to increase challenge with motor control as pt tends to overcompensate with his strength.  Pt beginning to demonstrate some awareness of decreased memory.  Therapy Documentation Precautions:  Precautions Precautions: Fall Precaution Comments: decr body awareness and decr sensation Restrictions Weight Bearing Restrictions: No Pain: Pain Assessment Pain Score: 2  ADL: ADL ADL Comments: see functional navigator  See Function Navigator for Current Functional Status.   Therapy/Group: Individual Therapy  [name] 04/20/2016, 12:37 PM

## 2016-04-20 NOTE — Progress Notes (Signed)
Speech Language Pathology Daily Session Note  Patient Details  Name: Victor Calhoun MRN: [id number] Date of Birth: November 12, 1956  Today's Date: 04/20/2016 SLP Individual Time: 1120-1200 SLP Individual Time Calculation (min): 40 min  Short Term Goals: Week 1: SLP Short Term Goal 1 (Week 1): Pt attend to L during functional visual tasks (pen and paper or otherwise) at mod I.  SLP Short Term Goal 2 (Week 1): Pt demonstrate selective attention to activity for 10 minutes with min A. SLP Short Term Goal 3 (Week 1): Pt to demonstrate recall of new information (auditory or visually presented) with min A semantic cueing. SLP Short Term Goal 4 (Week 1): Pt demonstrate reading comprehension at the sentence level at mod I.  Skilled Therapeutic Interventions:  Pt was seen for skilled ST targeting cognitive goals.  SLP facilitated the session with semi-complex money management tasks targeting semi-complex functional problem solving.  Pt was able to count money and sort change for 100% accuracy with supervision cues.  SLP also facilitated the session with a novel card game targeting attention to task and visual scanning to the left of midline.  Pt required x1 min assist verbal cues to identify matches from a field of 9, moderately visually distracting cards.  Pt also required min assist verbal cues for route recall when ambulating back to room from [place] treatment room.  Pt was left in bed with bed alarm set and call bell within reach.  Continue per current plan of care.      Function:  Eating Eating                 Cognition Comprehension Comprehension assist level: Understands complex 90% of the time/cues 10% of the time  Expression   Expression assist level: Expresses complex 90% of the time/cues < 10% of the time  Social Interaction Social Interaction assist level: Interacts appropriately 90% of the time - Needs monitoring or encouragement for participation or interaction.  Problem Solving  Problem solving assist level: Solves basic 90% of the time/requires cueing < 10% of the time  Memory Memory assist level: Recognizes or recalls 50 - 74% of the time/requires cueing 25 - 49% of the time    Pain Pain Assessment Pain Assessment: No/denies pain  Therapy/Group: Individual Therapy  Sharifah Champine, Selinda Orion 04/20/2016, 4:28 PM

## 2016-04-20 NOTE — Progress Notes (Signed)
60 year old right-handed non-English speaking male from [place] with no significant medical history except tobacco abuse. By chart review patient lives alone but does have a roommate currently working in [employer] [facility] and cannot assist. He does have a girlfriend that can assist but is awaiting a surgery. One level apartment with level entry. 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 Initial cranial CT scan negative. MRI of the brain showed diffusion abnormality right thalamus and throughout right hippocampal formation. 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 Severe stenosis distal right vertebral artery  Subjective/Complaints: Still with headache "poqito dolor", points to left side and says "asleep" ROS- diificult to assess due to language   Objective: Vital Signs: Blood pressure 106/73, pulse 76, temperature 97.9 F (36.6 C), temperature source Oral, resp. rate 18, height 5\' 6"  (1.676 m), weight 71.714 kg (158 lb 1.6 oz), SpO2 96 %. No results found. No results found for this or any previous visit (from the past 72 hour(s)).   HEENT: poor dentition Cardio: RRR and no murmur Resp: CTA B/L and unlabored GI: BS positive and NT, ND Extremity:  Pulses positive and No Edema Skin:   Other vitiligo Neuro: Alert/Oriented, Abnormal Sensory reduced in LUE and LLE, Abnormal Motor 4/5 in LUE , 5/5 in RUE and in LEs, Abnormal FMC Ataxic/ dec FMC and Dysarthric Musc/Skel:  Other no pain with UE or LE ROM Gen NAD   Assessment/Plan: 1. Functional deficits secondary to  right thalamus and right hippocampus infarct, Left hemisensory deficits with ataxia which require 3+ hours per day of interdisciplinary therapy in a comprehensive  inpatient rehab setting. Physiatrist is providing close team supervision and 24 hour management of active medical problems listed below. Physiatrist and rehab team continue to assess barriers to discharge/monitor patient progress toward functional and medical goals. FIM: Function - Bathing Position: Shower Body parts bathed by patient: Right arm, Left arm, Chest, Abdomen, Front perineal area, Buttocks, Right upper leg, Left upper leg, Right lower leg, Left lower leg, Back Body parts bathed by helper: Back Assist Level: Supervision or verbal cues, Set up Set up : To obtain items  Function- Upper Body Dressing/Undressing What is the patient wearing?: Pull over shirt/dress Pull over shirt/dress - Perfomed by patient: Thread/unthread right sleeve, Thread/unthread left sleeve, Put head through opening, Pull shirt over trunk Assist Level: Set up Set up : To obtain clothing/put away Function - Lower Body Dressing/Undressing What is the patient wearing?: Underwear, Pants, Non-skid slipper socks Position:  (standing in bathroom) Underwear - Performed by patient: Thread/unthread right underwear leg, Thread/unthread left underwear leg, Pull underwear up/down Pants- Performed by patient: Thread/unthread right pants leg, Thread/unthread left pants leg, Pull pants up/down Non-skid slipper socks- Performed by patient: Don/doff right sock, Don/doff left sock (doff) Shoes - Performed by patient: Don/doff right shoe, Don/doff left shoe (cowboy boots) TED Hose - Performed by helper: Don/doff right TED hose, Don/doff left TED hose Assist for footwear: Setup Assist for lower body dressing: Supervision or verbal cues  Function - Toileting Toileting activity did not occur: No continent bowel/bladder event Toileting steps completed by patient: Adjust clothing prior to toileting, Performs perineal hygiene, Adjust clothing after toileting Toileting Assistive Devices: Grab bar or rail Assist level: Supervision  or verbal cues  Function Midwife transfer assistive  device: Grab bar Assist level to toilet: Supervision or verbal cues Assist level from toilet: Supervision or verbal cues  Function - Chair/bed transfer Chair/bed transfer method: Ambulatory Chair/bed transfer assist level: Supervision or verbal cues Chair/bed transfer details: Verbal cues for precautions/safety, Verbal cues for safe use of DME/AE  Function - Locomotion: Wheelchair Will patient use wheelchair at discharge?: No Function - Locomotion: Ambulation Assistive device: No device Max distance: 170 ft Assist level: No help, No cues, assistive device, takes more than a reasonable amount of time Assist level: No help, No cues, assistive device, takes more than a reasonable amount of time Assist level: No help, No cues, assistive device, takes more than a reasonable amount of time Assist level: No help, No cues, assistive device, takes more than a reasonable amount of time Assist level: Supervision or verbal cues  Function - Comprehension Comprehension: Auditory Comprehension assist level: Understands complex 90% of the time/cues 10% of the time  Function - Expression Expression: Verbal Expression assist level: Expresses basic needs/ideas: With no assist  Function - Social Interaction Social Interaction assist level: Interacts appropriately 75 - 89% of the time - Needs redirection for appropriate language or to initiate interaction.  Function - Problem Solving Problem solving assist level: Solves basic 90% of the time/requires cueing < 10% of the time  Function - Memory Memory assist level: Recognizes or recalls 50 - 74% of the time/requires cueing 25 - 49% of the time Patient normally able to recall (first 3 days only): Current season, Location of own room, Staff names and faces, That he or she is in a hospital   Medical Problem List and Plan: 1.  Left side weakness and dizziness secondary to right  thalamus and right hippocampus infarct, R PCA occlusion- continue rehab program 2.  DVT Prophylaxis/Anticoagulation: Subcutaneous Lovenox. Monitor platelet counts of any signs of bleeding 3. Pain Management: Fioricet for vascular headaches, topamax 25mg  daily may titrate to BID  4. Hypertension. Lopressor 25 mg twice a day, BP well controlled, little on low side but HR is at goal range 5. Neuropsych: This patient is capable of making decisions on his own behalf. 6. Skin/Wound Care: Routine skin checks 7. Fluids/Electrolytes/Nutrition: Routine I&O with follow-up chemistries 8. Hyperlipidemia. Lipitor 9. Tobacco abuse. Counseling  LOS (Days) 5 A FACE TO FACE EVALUATION WAS PERFORMED  Genevieve Ritzel E 04/20/2016, 8:20 AM

## 2016-04-20 NOTE — Progress Notes (Signed)
Physical Therapy Session Note  Patient Details  Name: Victor Calhoun MRN: [id number] Date of Birth: 1956-12-14  Today's Date: 04/20/2016 PT Individual Time: 0900-1000 PT Individual Time Calculation (min): 60 min   Short Term Goals: Week 1:  PT Short Term Goal 1 (Week 1): =LTG due to estimated LOS  Skilled Therapeutic Interventions/Progress Updates:    Pt received supine in bed, c/o mild headache but does not rate and reports he has recently taken medication for it; otherwise agreeable to treatment. Gait to gym with S; min verbal cues to locate gym. Wii balance games with max verbal cues and limited in game play by L inattention. Nustep x10 min level 5 with average 50 steps/min for strengthening and aerobic endurance. [employer]ision performed on mode A endurance, and mode A with lower quadrants only. Initial reaction time to R quadrants and L upper approximately 2 sec, bottom L quadrant 4 seconds. With bottom quadrants only, speed normalized to approximately 2 sec B sides. Educated pt on L inattention, return of use/feeling of L side with increase in use during functional tasks. Returned to room with S; remained seated on EOB with alarm intact at completion of session.   Therapy Documentation Precautions:  Precautions Precautions: Fall Precaution Comments: decr body awareness and decr sensation Restrictions Weight Bearing Restrictions: No Pain: Pain Assessment Pain Assessment: 0-10 Pain Score: 2  Pain Type: Acute pain Pain Location: Head Pain Orientation: Right;Left;Anterior Pain Descriptors / Indicators: Headache Pain Frequency: Intermittent Pain Onset: Gradual Patients Stated Pain Goal: 1 Pain Intervention(s): Medication (See eMAR)   See Function Navigator for Current Functional Status.   Therapy/Group: Individual Therapy  Luberta Mutter 04/20/2016, 12:09 PM

## 2016-04-21 ENCOUNTER — Inpatient Hospital Stay (HOSPITAL_COMMUNITY): Payer: MEDICAID | Admitting: Occupational Therapy

## 2016-04-21 ENCOUNTER — Inpatient Hospital Stay (HOSPITAL_COMMUNITY): Payer: MEDICAID | Admitting: Speech Pathology

## 2016-04-21 ENCOUNTER — Inpatient Hospital Stay (HOSPITAL_COMMUNITY): Payer: MEDICAID | Admitting: Physical Therapy

## 2016-04-21 NOTE — Progress Notes (Signed)
Occupational Therapy Session Note  Patient Details  Name: Oxley Sarni MRN: [id number] Date of Birth: 09-12-1956  Today's Date: 04/21/2016 OT Individual Time: 0900-1000 and 1305-1350 OT Individual Time Calculation (min): 60 min and 45 min   Short Term Goals: Week 1:  OT Short Term Goal 1 (Week 1): Pt will not bump into objects in left environment in a 60 min session with min questioning cues  OT Short Term Goal 2 (Week 1): Pt will perform shower standing with supervision  OT Short Term Goal 3 (Week 1): Pt will use bilateral UE in 3/3 grooming tasks at sink without cuing  OT Short Term Goal 4 (Week 1): Pt will perform grooming tasks at sink mod I   Skilled Therapeutic Interventions/Progress Updates:    1) Treatment session with focus on initiation, awareness of deficits, and problem solving.  Pt declined bathing and dressing this session, but donned shoes with increased time.  Ambulated to therapy gym with focus on reciprocal swing of arms to improve motor control and decrease abduction of arm during gait.  Engaged in 3D pipe tree puzzle in standing with focus on functional use of LUE and motor control.  Pt required increased time and mod cues for problem solving with selecting correct pipe pieces.  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    2) Treatment session with focus on self-care retraining.  Pt completed bathing and dressing with min cues for initiation to gather clothing prior to bathing.  Toilet transfer distant supervision with pt completing toileting tasks at Mod I level.  Bathing overall distant supervision at sit > stand level in room shower.  Dressing completed with supervision as pt donned underwear and pants in standing with no LOB.  Discussed recommendation for intermittent supervision and concerns due to pt's girlfriend's upcoming surgery.  Discussed safety concerns due to decreased memory and awareness of deficits.  Therapy Documentation Precautions:   Precautions Precautions: Fall Precaution Comments: decr body awareness and decr sensation Restrictions Weight Bearing Restrictions: No Pain: Pain Assessment Pain Assessment: No/denies pain Pain Score: 0-No pain  See Function Navigator for Current Functional Status.   Therapy/Group: Individual Therapy  [name] 04/21/2016, 12:15 PM

## 2016-04-21 NOTE — Progress Notes (Signed)
Physical Therapy Session Note  Patient Details  Name: Victor Calhoun MRN: [id number] Date of Birth: 04/29/1956  Today's Date: 04/21/2016 PT Individual Time: 1100-1200 PT Individual Time Calculation (min): 60 min   Short Term Goals: Week 1:  PT Short Term Goal 1 (Week 1): =LTG due to estimated LOS  Skilled Therapeutic Interventions/Progress Updates:    Pt received seated on EOB; denies pain and agreeable to treatment. Gait in hallway 2x500-600' per trial with mod/max verbal cues to locate room, correctly utilize elevator and navigate from one floor to another. Reports increase in L sided "heaviness" with increased activity; educated pt regarding expectation that he will have to give his brain time to heal, increased use of L side to improve strength and coordination, and that change will not happen overnight. Nustep x10 min BUE/BLE level 5 for strength, aerobic endurance. Sit <>stand with 7# weights BUE performing bicep curl with each rep; 3 sets 10 reps. Returned to room with S; remained seated at EOB with alarm intact at completion of session, all needs within reach.   Therapy Documentation Precautions:  Precautions Precautions: Fall Precaution Comments: decr body awareness and decr sensation Restrictions Weight Bearing Restrictions: No Pain: Pain Assessment Pain Assessment: No/denies pain Pain Score: 0-No pain   See Function Navigator for Current Functional Status.   Therapy/Group: Individual Therapy  Luberta Mutter 04/21/2016, 12:05 PM

## 2016-04-21 NOTE — Progress Notes (Signed)
Speech Language Pathology Daily Session Note  Patient Details  Name: Victor Calhoun MRN: [id number] Date of Birth: 1956/02/02  Today's Date: 04/21/2016 SLP Individual Time: 1000-1100 SLP Individual Time Calculation (min): 60 min  Short Term Goals: Week 1: SLP Short Term Goal 1 (Week 1): Pt attend to L during functional visual tasks (pen and paper or otherwise) at mod I.  SLP Short Term Goal 2 (Week 1): Pt demonstrate selective attention to activity for 10 minutes with min A. SLP Short Term Goal 3 (Week 1): Pt to demonstrate recall of new information (auditory or visually presented) with min A semantic cueing. SLP Short Term Goal 4 (Week 1): Pt demonstrate reading comprehension at the sentence level at mod I.  Skilled Therapeutic Interventions:  Pt was seen for skilled ST targeting cognitive goals.  Pt did not recall therapist from yesterday's therapy session and required mod assist question cues for recall of 2 therapeutic activities.  Pt also required min assist question cues to recall at least 1 detail from OT therapy session which occurred immediately prior to ST treatment.  Pt endorses memory changes.  SLP facilitated the session with a novel game targeting use of memory compensatory strategies.  Pt required max assist verbal cues to make word-picture associations to facilitate recall of 4 categories.   Pt then required mod assist verbal cues to generate the names of specific category members.  Min-mod assist verbal cues needed for use of external aids to facilitate route finding when ambulating back to room from [place] treatment room. Pt was left in bed with bed alarm set and call bell left within reach. Continue per current plan of care.    Function:  Eating Eating                 Cognition Comprehension Comprehension assist level: Follows basic conversation/direction with no assist  Expression   Expression assist level: Expresses basic needs/ideas: With no assist  Social  Interaction Social Interaction assist level: Interacts appropriately 90% of the time - Needs monitoring or encouragement for participation or interaction.  Problem Solving Problem solving assist level: Solves basic 75 - 89% of the time/requires cueing 10 - 24% of the time  Memory Memory assist level: Recognizes or recalls 50 - 74% of the time/requires cueing 25 - 49% of the time    Pain Pain Assessment Pain Assessment: No/denies pain Pain Score: 0-No pain  Therapy/Group: Individual Therapy  Tanish Prien, Selinda Orion 04/21/2016, 4:03 PM

## 2016-04-21 NOTE — Progress Notes (Addendum)
Subjective/Complaints: Spoke with pt , Spanish speaking RN interpreting, Girlfriend can assist but is having surgery soon.  Roommate works ROS- diificult to assess due to language   Objective: Vital Signs: Blood pressure 104/75, pulse 63, temperature 97.7 F (36.5 C), temperature source Oral, resp. rate 17, height 5\' 6"  (1.676 m), weight 71.714 kg (158 lb 1.6 oz), SpO2 97 %. No results found. No results found for this or any previous visit (from the past 72 hour(s)).   HEENT: poor dentition Cardio: RRR and no murmur Resp: CTA B/L and unlabored GI: BS positive and NT, ND Extremity:  Pulses positive and No Edema Skin:   Other vitiligo Neuro: Alert/Oriented, Abnormal Sensory reduced in LUE and LLE, Abnormal Motor 4/5 in LUE , 5/5 in RUE and in LEs, Abnormal FMC Ataxic/ dec FMC and Dysarthric Musc/Skel:  Other no pain with UE or LE ROM Gen NAD   Assessment/Plan: 1. Functional deficits secondary to  right thalamus and right hippocampus infarct, Left hemisensory deficits with ataxia which require 3+ hours per day of interdisciplinary therapy in a comprehensive inpatient rehab setting. Physiatrist is providing close team supervision and 24 hour management of active medical problems listed below. Physiatrist and rehab team continue to assess barriers to discharge/monitor patient progress toward functional and medical goals. FIM: Function - Bathing Position: Shower Body parts bathed by patient: Right arm, Left arm, Chest, Abdomen, Front perineal area, Buttocks, Right upper leg, Left upper leg, Right lower leg, Left lower leg, Back Body parts bathed by helper: Back Assist Level: Set up Set up : To obtain items  Function- Upper Body Dressing/Undressing What is the patient wearing?: Pull over shirt/dress Pull over shirt/dress - Perfomed by patient: Thread/unthread right sleeve, Thread/unthread left sleeve, Put head through opening, Pull shirt over trunk Assist Level: Set up Set up : To  obtain clothing/put away Function - Lower Body Dressing/Undressing What is the patient wearing?: Underwear, Pants, Non-skid slipper socks Position:  (shower chair) Underwear - Performed by patient: Thread/unthread right underwear leg, Thread/unthread left underwear leg, Pull underwear up/down Pants- Performed by patient: Thread/unthread right pants leg, Thread/unthread left pants leg, Pull pants up/down Non-skid slipper socks- Performed by patient: Don/doff right sock, Don/doff left sock Shoes - Performed by patient: Don/doff right shoe, Don/doff left shoe (cowboy boots) TED Hose - Performed by helper: Don/doff right TED hose, Don/doff left TED hose Assist for footwear: Setup Assist for lower body dressing: Supervision or verbal cues  Function - Toileting Toileting steps completed by patient: Adjust clothing prior to toileting, Performs perineal hygiene, Adjust clothing after toileting Toileting Assistive Devices: Grab bar or rail Assist level: Supervision or verbal cues  Function Midwife transfer assistive device: Grab bar Assist level to toilet: Supervision or verbal cues Assist level from toilet: Supervision or verbal cues  Function - Chair/bed transfer Chair/bed transfer method: Ambulatory Chair/bed transfer assist level: Supervision or verbal cues Chair/bed transfer details: Verbal cues for precautions/safety, Verbal cues for safe use of DME/AE  Function - Locomotion: Wheelchair Will patient use wheelchair at discharge?: No Function - Locomotion: Ambulation Assistive device: No device Max distance: 170 ft Assist level: Supervision or verbal cues Assist level: Supervision or verbal cues Assist level: Supervision or verbal cues Assist level: Supervision or verbal cues Assist level: Supervision or verbal cues  Function - Comprehension Comprehension: Auditory Comprehension assist level: Understands complex 90% of the time/cues 10% of the time  Function -  Expression Expression: Verbal Expression assist level: Expresses complex 90% of the  time/cues < 10% of the time  Function - Social Interaction Social Interaction assist level: Interacts appropriately 90% of the time - Needs monitoring or encouragement for participation or interaction.  Function - Problem Solving Problem solving assist level: Solves basic 90% of the time/requires cueing < 10% of the time  Function - Memory Memory assist level: Recognizes or recalls 50 - 74% of the time/requires cueing 25 - 49% of the time Patient normally able to recall (first 3 days only): Current season, Location of own room, Staff names and faces, That he or she is in a hospital   Medical Problem List and Plan: 1.  Left side weakness and dizziness secondary to right thalamus and right hippocampus infarct, R PCA occlusion- supervision mobility without assistive device 2.  DVT Prophylaxis/Anticoagulation: Subcutaneous Lovenox. Monitor platelet counts of any signs of bleeding 3. Pain Management: Fioricet for vascular headaches, topamax 25mg  BID no HA this am 4. Hypertension. Lopressor 25 mg twice a day, BP well controlled, little on low side but HR is at goal range 5. Neuropsych: This patient is capable of making decisions on his own behalf. 6. Skin/Wound Care: Routine skin checks 7. Fluids/Electrolytes/Nutrition: Routine I&O with follow-up chemistries 8. Hyperlipidemia. Lipitor 9. Tobacco abuse. Counseling  LOS (Days) 6 A FACE TO FACE EVALUATION WAS PERFORMED  Victor Calhoun E 04/21/2016, 7:59 AM

## 2016-04-22 ENCOUNTER — Inpatient Hospital Stay (HOSPITAL_COMMUNITY): Payer: MEDICAID | Admitting: Physical Therapy

## 2016-04-22 ENCOUNTER — Inpatient Hospital Stay (HOSPITAL_COMMUNITY): Payer: Self-pay | Admitting: Physical Therapy

## 2016-04-22 ENCOUNTER — Inpatient Hospital Stay (HOSPITAL_COMMUNITY): Payer: MEDICAID | Admitting: Occupational Therapy

## 2016-04-22 ENCOUNTER — Inpatient Hospital Stay (HOSPITAL_COMMUNITY): Payer: Self-pay | Admitting: Occupational Therapy

## 2016-04-22 ENCOUNTER — Inpatient Hospital Stay (HOSPITAL_COMMUNITY): Payer: MEDICAID | Admitting: Speech Pathology

## 2016-04-22 DIAGNOSIS — IMO0002 Reserved for concepts with insufficient information to code with codable children: Secondary | ICD-10-CM

## 2016-04-22 DIAGNOSIS — I69398 Other sequelae of cerebral infarction: Secondary | ICD-10-CM

## 2016-04-22 DIAGNOSIS — R208 Other disturbances of skin sensation: Secondary | ICD-10-CM

## 2016-04-22 LAB — CREATININE, SERUM
CREATININE: 1.4 mg/dL — AB (ref 0.61–1.24)
GFR calc Af Amer: >60 mL/min (ref >60)
GFR calc non Af Amer: 54 mL/min — ABNORMAL LOW (ref >60)

## 2016-04-22 NOTE — Progress Notes (Signed)
Subjective/Complaints: Interpreter in room, pt asking about d/c date, we discussed that it will be discussed today in care team ROS- diificult to assess due to language   Objective: Vital Signs: Blood pressure 150/54, pulse 59, temperature 97.9 F (36.6 C), temperature source Oral, resp. rate 18, height 5' 6"  (1.676 m), weight 71.714 kg (158 lb 1.6 oz), SpO2 98 %. No results found. Results for orders placed or performed during the hospital encounter of 04/15/16 (from the past 72 hour(s))  Creatinine, serum     Status: Abnormal   Collection Time: 04/22/16  5:37 AM  Result Value Ref Range   Creatinine, Ser 1.40 (H) 0.61 - 1.24 mg/dL   GFR calc non Af Amer 54 (L) >60 mL/min   GFR calc Af Amer >60 >60 mL/min    Comment: (NOTE) The eGFR has been calculated using the CKD EPI equation. This calculation has not been validated in all clinical situations. eGFR's persistently <60 mL/min signify possible Chronic Kidney Disease.      HEENT: poor dentition Cardio: RRR and no murmur Resp: CTA B/L and unlabored GI: BS positive and NT, ND Extremity:  Pulses positive and No Edema Skin:   Other vitiligo Neuro: Alert/Oriented, Abnormal Sensory reduced in LUE and LLE, Abnormal Motor 4/5 in LUE , 5/5 in RUE and in LEs, Abnormal FMC Ataxic/ dec FMC and Dysarthric Musc/Skel:  Other no pain with UE or LE ROM Gen NAD   Assessment/Plan: 1. Functional deficits secondary to  right thalamus and right hippocampus infarct, Left hemisensory deficits with ataxia which require 3+ hours per day of interdisciplinary therapy in a comprehensive inpatient rehab setting. Physiatrist is providing close team supervision and 24 hour management of active medical problems listed below. Physiatrist and rehab team continue to assess barriers to discharge/monitor patient progress toward functional and medical goals. FIM: Function - Bathing Position: Shower Body parts bathed by patient: Right arm, Left arm, Chest,  Abdomen, Front perineal area, Buttocks, Right upper leg, Left upper leg, Right lower leg, Left lower leg, Back Body parts bathed by helper: Back Assist Level: Set up Set up : To obtain items  Function- Upper Body Dressing/Undressing What is the patient wearing?: Pull over shirt/dress Pull over shirt/dress - Perfomed by patient: Thread/unthread right sleeve, Thread/unthread left sleeve, Put head through opening, Pull shirt over trunk Assist Level: More than reasonable time Set up : To obtain clothing/put away Function - Lower Body Dressing/Undressing What is the patient wearing?: Underwear, Pants, Socks, Shoes Position:  (standing in bathroom) Underwear - Performed by patient: Thread/unthread right underwear leg, Thread/unthread left underwear leg, Pull underwear up/down Pants- Performed by patient: Thread/unthread right pants leg, Thread/unthread left pants leg, Pull pants up/down Non-skid slipper socks- Performed by patient: Don/doff right sock, Don/doff left sock Socks - Performed by patient: Don/doff right sock, Don/doff left sock Shoes - Performed by patient: Don/doff right shoe, Don/doff left shoe, Fasten right, Fasten left TED Hose - Performed by helper: Don/doff right TED hose, Don/doff left TED hose Assist for footwear: Independent Assist for lower body dressing: Supervision or verbal cues  Function - Toileting Toileting steps completed by patient: Adjust clothing prior to toileting, Performs perineal hygiene, Adjust clothing after toileting Toileting Assistive Devices: Grab bar or rail Assist level: No help/no cues  Function Midwife transfer assistive device: Grab bar Assist level to toilet: Supervision or verbal cues Assist level from toilet: Supervision or verbal cues  Function - Chair/bed transfer Chair/bed transfer method: Ambulatory Chair/bed transfer assist level:  Supervision or verbal cues Chair/bed transfer details: Verbal cues for  precautions/safety, Verbal cues for safe use of DME/AE  Function - Locomotion: Wheelchair Will patient use wheelchair at discharge?: No Function - Locomotion: Ambulation Assistive device: No device Max distance: 300 Assist level: Supervision or verbal cues Assist level: Supervision or verbal cues Assist level: Supervision or verbal cues Assist level: Supervision or verbal cues Assist level: Supervision or verbal cues  Function - Comprehension Comprehension: Auditory Comprehension assist level: Follows basic conversation/direction with no assist  Function - Expression Expression: Verbal Expression assist level: Expresses basic needs/ideas: With extra time/assistive device  Function - Social Interaction Social Interaction assist level: Interacts appropriately 90% of the time - Needs monitoring or encouragement for participation or interaction.  Function - Problem Solving Problem solving assist level: Solves basic 75 - 89% of the time/requires cueing 10 - 24% of the time  Function - Memory Memory assist level: Recognizes or recalls 50 - 74% of the time/requires cueing 25 - 49% of the time Patient normally able to recall (first 3 days only): Current season, Location of own room, Staff names and faces, That he or she is in a hospital   Medical Problem List and Plan: 1.  Left side weakness and dizziness secondary to right thalamus and right hippocampus infarct, R PCA occlusion- Team conference today please see physician documentation under team conference tab, met with team face-to-face to discuss problems,progress, and goals. Formulized individual treatment plan based on medical history, underlying problem and comorbidities. 2.  DVT Prophylaxis/Anticoagulation: Subcutaneous Lovenox. Monitor platelet counts of any signs of bleeding 3. Pain Management: Fioricet for vascular headaches,Resolved on topamax 51m BID  4. Hypertension. Lopressor 25 mg twice a day, BP well controlled, little on  low side but HR is at goal range Filed Vitals:   04/21/16 1932 04/22/16 0542  BP: 123/66 150/54  Pulse: 77 59  Temp:  97.9 F (36.6 C)  Resp:  18   5. Neuropsych: This patient is capable of making decisions on his own behalf. 6. Skin/Wound Care: Routine skin checks 7. Fluids/Electrolytes/Nutrition: Routine I&O with follow-up chemistries 8. Hyperlipidemia. Lipitor 9. Tobacco abuse. Counseling  LOS (Days) 7 A FACE TO FACE EVALUATION WAS PERFORMED  KIRSTEINS,ANDREW E 04/22/2016, 8:49 AM

## 2016-04-22 NOTE — Patient Care Conference (Signed)
Inpatient RehabilitationTeam Conference and Plan of Care Update Date: 04/22/2016   Time: 11:25 AM   Patient Name: Victor Calhoun      Medical Record Number: [id number]  Date of Birth: 1956-09-13 Sex: Male         Room/Bed: 4W25C/4W25C-01 Payor Info: Payor: MEDICAID POTENTIAL / Plan: MEDICAID POTENTIAL / Product Type: *No Product type* /    Admitting Diagnosis: R CVA  Admit Date/Time:  04/15/2016  4:58 PM Admission Comments: No comment available   Primary Diagnosis:  <principal problem not specified> Principal Problem: <principal problem not specified>  Patient Active Problem List   Diagnosis Date Noted  . Altered sensation due to stroke 04/22/2016  . CVA (cerebral vascular accident) ([place]) 04/15/2016  . Hemiparesis ([place])   . Other vascular headache   . Benign essential HTN   . HLD (hyperlipidemia)   . Tobacco abuse   . Acute CVA (cerebrovascular accident) ([place]) 04/14/2016  . TIA (transient ischemic attack) 04/13/2016    Expected Discharge Date: Expected Discharge Date: 04/24/16  Team Members Present: Physician leading conference: Dr. Alysia Penna Social Worker Present: Ovidio Kin, LCSW Nurse Present: Rayetta Pigg, RN PT Present: Jorge Mandril, PT OT Present: [name], OT SLP Present: Windell Moulding, SLP PPS Coordinator present : Daiva Nakayama, RN, CRRN     Current Status/Progress Goal Weekly Team Focus  Medical   HA improved  home at Mod I level  D.C. planning   Bowel/Bladder   cont x2. LBM: 04/21/2016  remain cont x2  continue with plan of care    Swallow/Nutrition/ Hydration             ADL's   supervision/setup self-care tasks, min cues for problem solving  Mod I self-care tasks, supervision meal prep  pt/family education, LUE NMR, Lt attention during self-care tasks and mobility   Mobility   S all mobility, limited by L inattention, impaired problem solving  mod I overall, S community mobility  L attention, safety awareness, endurance, higher level  balance   Communication             Safety/Cognition/ Behavioral Observations  left inattention, poor recall of basic information, decreased functional problem solving   min assist   basic to semi-complex cognition    Pain   No complaints of headaches since topamax was increased. Pt is complaining of his left side being "asleep". Numb and Tingling to UE and LE   <2  assess and treat qshift and PRN    Skin   C/D/I Braden scale score=20  remain free from infection/breakdown while on rehab   assess qshift      *See Care Plan and progress notes for long and short-term goals.  Barriers to Discharge: limited assist at home    Possible Resolutions to Barriers:  achieve Mod I level for mobility    Discharge Planning/Teaching Needs:  Home with friends and others checking in on him. His grilfriend having surgery today-unsure how much she can do. Needs to be as independent as possible prior to discharge      Team Discussion:  Goals mod/i level but supervision with problem solving, re-call and meal prep. Headache controlled with topamax. Pt  Girlfriend havig surgery today. Pt to talk with friends to help him out. Wants to know when he can go back to work. Will ask MD.  Revisions to Treatment Plan:  DC Friday   Continued Need for Acute Rehabilitation Level of Care: The patient requires daily medical management by a physician  with specialized training in physical medicine and rehabilitation for the following conditions: Daily direction of a multidisciplinary physical rehabilitation program to ensure safe treatment while eliciting the highest outcome that is of practical value to the patient.: Yes Daily medical management of patient stability for increased activity during participation in an intensive rehabilitation regime.: Yes Daily analysis of laboratory values and/or radiology reports with any subsequent need for medication adjustment of medical intervention for : Neurological  problems  Victor Calhoun, Gardiner Rhyme 04/22/2016, 12:50 PM

## 2016-04-22 NOTE — Progress Notes (Signed)
Physical Therapy Session Note  Patient Details  Name: Victor Calhoun MRN: [id number] Date of Birth: 1956/12/18  Today's Date: 04/22/2016 PT Individual Time: 1130-1200 PT Individual Time Calculation (min): 30 min   Short Term Goals: Week 1:  PT Short Term Goal 1 (Week 1): =LTG due to estimated LOS   Therapy Documentation Precautions:  Precautions Precautions: Fall Precaution Comments: decr body awareness and decr sensation Restrictions Weight Bearing Restrictions: No   Patient received in room with interpreter present. Patient ambulated greater than 500 feet over smooth level tile surfaces, outdoor uneven and unlevel surfaces. Patient did not experience any overt LOB or gait deficits. Patient requires supervision secondary to decreased environmental awareness, topographical orientation, way finding,attention and following commands. Patient tolerated tx well.Patient returned to room at end of session with all needs met and bed alarm engaged.      See Function Navigator for Current Functional Status.   Therapy/Group: Individual Therapy  Retta Diones 04/22/2016, 1:45 PM

## 2016-04-22 NOTE — Progress Notes (Signed)
Social Work Patient ID: Victor Calhoun, male   DOB: [birth year], 58 y.o.   MRN: [id number] Met with pt with interpreter to discuss team conference goals mod/i-supervision and discharge 4/28. Discussed he needs someone with him at discharge for meal preparation and make sure he is safe At home. He will call some of his friends but most of the work, so he is not sure and girlfriend is having surgery today so unsure how much if any she can assist. This worker does not have options to offer due to he is uninsured. Will see what he finds out today after calling his friends.

## 2016-04-22 NOTE — Discharge Summary (Signed)
NAME:  Victor Calhoun, Victor Calhoun          ACCOUNT NO.:  [id number]  MEDICAL RECORD NO.:  [id number]  LOCATION:  4W25C                        FACILITY:  [facility]  PHYSICIAN:  Charlett Blake, M.D.DATE OF BIRTH:  1956-01-19  DATE OF ADMISSION:  04/15/2016 DATE OF DISCHARGE:  04/24/2016                              DISCHARGE SUMMARY   DISCHARGE DIAGNOSES: 1. Right thalamus and right hippocampus infarct, right PCA occlusion. 2. Subcutaneous Lovenox for DVT prophylaxis.  3. Hypertension. 4. Hyperlipidemia. 5. Pain management. 6. Tobacco abuse.  HISTORY OF PRESENT ILLNESS:  This is a 60 year old right-handed non- English-speaking male from [place] with no significant past medical history except tobacco abuse.  By chart review, he lives alone.  He does have a roommate.  He does have a girlfriend, who can assist, bu awaiting surgery.  One-level apartment with level entry.  The patient independent prior to admission, working Architect.  Presented on April 13, 2016, with left-sided weakness and dizziness.  Initial cranial CT scan negative.  MRI of the brain showed diffusion abnormality, right thalamus and throughout right hippocampal formation.  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  MRI of the head with occlusion of right posterior artery proximally.  Severe stenosis distal right vertebral artery.  The patient did not receive tPA.  Carotid Dopplers with no ICA stenosis.  Echocardiogram with ejection fraction of 65%.  No wall motion abnormalities.  Grade 1 diastolic dysfunction.  Neurology consulted, placed on aspirin for CVA prophylaxis.  Subcutaneous Lovenox for DVT prophylaxis.  Close monitoring of blood pressure with Lopressor added. The patient was admitted for a comprehensive rehab program.  PAST MEDICAL HISTORY:  See discharge diagnoses.  SOCIAL HISTORY:  Independent prior to admission living with  roommate. He has a girlfriend with good support.  Functional status upon admission to rehab services:  Minimal assist 60 feet rolling walker; minimal assist, sit to stand; min mod assist for activities of daily living.  PHYSICAL EXAMINATION:  VITAL SIGNS:  Blood pressure 126/95, pulse 79, temperature 97, respirations 18. GENERAL:  This was an alert male, well developed, very limited [place]- speaking, does follow simple demonstrated commands. LUNGS:  Clear to auscultation without wheeze. CARDIAC:  Regular rate and rhythm without murmur. ABDOMEN:  Soft, nontender.  Good bowel sounds.  REHABILITATION HOSPITAL COURSE:  The patient was admitted to inpatient rehab services with therapies initiated on a 3-hour daily basis consisting of physical therapy, occupational therapy, speech therapy, and rehabilitation nursing.  The following issues were addressed during the patient's rehabilitation stay.  Pertaining to Mr. Victor Calhoun right thalamus and right hippocampus infarct remained stable, maintained on aspirin therapy.  Tolerating a regular diet.  Subcutaneous Lovenox for DVT prophylaxis.  No bleeding episodes.  Bouts of vascular headaches with trial of Topamax with good results.  Blood pressures controlled on Lopressor 25 mg p.o. b.i.d.  Case management working to address need for PCP at time of discharge.  The patient did have a history of tobacco abuse.  He did receive counseling through a translator on the need for cessation of nicotine products.  It was questionable if he would be compliant with these requests.  No bowel or  bladder disturbances.  The patient received weekly collaborative interdisciplinary team conferences to discuss estimated length of stay, family teaching, and any barriers to discharge.  Ambulating greater than 500 feet over smooth level surfaces, outdoor and uneven level surfaces.  Required supervision secondary to decreased environmental awareness,  topographical orientation.  Gather belongings for activities of daily living and homemaking.  He did decline bathing at some points.  Completed dressing with minimal cues, toilet transfers with modified independence.  Speech Therapy followup.  Facilitated sessions with novel card gain targeting functional problem solving.  He was able to plan and execute a problem solving strategy.  He could communicate his basic needs.  Interacts appropriately with interpreter. It was discussed at length through interpreter to patient and family the need for 24-hour supervision advised no driving smoking or alcohol.  Full teaching completed plan discharged to home with ongoing therapies dictated per Rehab Services.  DISCHARGE MEDICATIONS: 1. Aspirin 325 mg p.o. daily. 2. Lipitor 40 mg p.o. daily. 3. Lopressor 25 mg p.o. b.i.d. 4. Topamax 25 mg p.o. b.i.d.  DIET:  Regular consistency.  Special instructions. No driving no smoking no alcohol  He would follow up with Dr. Alysia Penna at the outpatient rehab service office as directed.  Ongoing therapies had been arranged.     Lauraine Rinne, P.A.   ______________________________ Charlett Blake, M.D.    DA/MEDQ  D:  04/22/2016  T:  04/22/2016  Job:  [id number]

## 2016-04-22 NOTE — Progress Notes (Signed)
Occupational Therapy Session Note  Patient Details  Name: Victor Calhoun MRN: [id number] Date of Birth: 1956-08-23  Today's Date: 04/22/2016 OT Individual Time: [id number] and 1300-1330 OT Individual Time Calculation (min): 60 min and 30 min   Short Term Goals: Week 1:  OT Short Term Goal 1 (Week 1): Pt will not bump into objects in left environment in a 60 min session with min questioning cues  OT Short Term Goal 2 (Week 1): Pt will perform shower standing with supervision  OT Short Term Goal 3 (Week 1): Pt will use bilateral UE in 3/3 grooming tasks at sink without cuing  OT Short Term Goal 4 (Week 1): Pt will perform grooming tasks at sink mod I   Skilled Therapeutic Interventions/Progress Updates:    1) Treatment session with focus on functional mobility and therapeutic activities to address LUE during functional tasks.  Pt declined bathing and dressing this session.  Ambulated to toilet Mod I and completed toileting tasks without assist.  Pt completed oral care in standing with increased time to brush and reapply dentures.  Increased time to don shoes as pt easily distracted by nurse tech arriving to change bedding.  Discussed team conference later this AM and began discussion regarding d/c home.  Pt reports no concerns with functional mobility, noted improvements in cognition, but main concern remains LUE and LLE "asleep".  Engaged in standing activity with focus on functional use of LUE to simulate work requirements with reaching overhead and incorporating fine motor tasks as well.  Min cues to incorporate LUE as occasionally would lose contact with item.  Pt demonstrated difficulty moving on to next step if unable to locate correct items.  Discussed concerns with higher level cognition for unexpected situations at home and return to work.  Pt reports he understands he cannot return to work until MD clears him.    2) Treatment session with focus on Lt attention and increased use of LUE.   Pt finishing lunch upon arrival, required increased time to finish and don shoes due to impaired initiation and awareness.  Ambulated to [facility] gym to complete [employer], pt requiring min cues to locate gym.  Pt located  27, 26, 28 lights on Dynavision in 60 seconds while using LUE only, minimal difference between Lt and Rt sides of board.  Increased challenge to 2 second time limit but allowing pt to use BUE.  Noted pt to not use LUE during initial trial at all, once allowed to utilize both.  Educated on decreased use of LUE during this task as well as noted during self-feeding.  Pt correctly selecting 35/43 in 60 seconds utilizing RUE only and 47/75 utilizing both, missing 50% in Lt quadrant compared to 25% in Rt.  Mod question cues to path find back to room.  Therapy Documentation Precautions:  Precautions Precautions: Fall Precaution Comments: decr body awareness and decr sensation Restrictions Weight Bearing Restrictions: No Pain: Pain Assessment Pain Assessment: No/denies pain ADL: ADL ADL Comments: see functional navigator  See Function Navigator for Current Functional Status.   Therapy/Group: Individual Therapy  Sita Mangen, [place] 04/22/2016, 10:00 AM

## 2016-04-22 NOTE — Progress Notes (Signed)
Speech Language Pathology Daily Session Note  Patient Details  Name: Meshulem Dunagan MRN: [id number] Date of Birth: [birth year]  Today's Date: 04/22/2016 SLP Individual Time: [id number] SLP Individual Time Calculation (min): 45 min  Short Term Goals: Week 1: SLP Short Term Goal 1 (Week 1): Pt attend to L during functional visual tasks (pen and paper or otherwise) at mod I.  SLP Short Term Goal 2 (Week 1): Pt demonstrate selective attention to activity for 10 minutes with min A. SLP Short Term Goal 3 (Week 1): Pt to demonstrate recall of new information (auditory or visually presented) with min A semantic cueing. SLP Short Term Goal 4 (Week 1): Pt demonstrate reading comprehension at the sentence level at mod I.  Skilled Therapeutic Interventions:  Pt was seen for skilled ST targeting cognitive goals.  SLP facilitated the session with a novel card game targeting functional problem solving.  Pt was able to plan and execute a problem solving strategy during the abovementioned task with overall min assist verbal cues for working memory of task procedures and rules.  Pt was left sitting at edge of bed at the end of today's therapy session.  Continue per current plan of care.    Function:  Eating Eating               Cognition Comprehension Comprehension assist level: Follows basic conversation/direction with no assist  Expression   Expression assist level: Expresses basic needs/ideas: With no assist  Social Interaction Social Interaction assist level: Interacts appropriately 90% of the time - Needs monitoring or encouragement for participation or interaction.  Problem Solving Problem solving assist level: Solves basic 50 - 74% of the time/requires cueing 25 - 49% of the time  Memory Memory assist level: Recognizes or recalls 50 - 74% of the time/requires cueing 25 - 49% of the time    Pain Pain Assessment Pain Assessment: No/denies pain  Therapy/Group: Individual Therapy  Tandy Lewin,  Selinda Orion 04/22/2016, 4:02 PM

## 2016-04-22 NOTE — Discharge Summary (Signed)
Discharge summary job # [id number]

## 2016-04-22 NOTE — Progress Notes (Signed)
Physical Therapy Session Note  Patient Details  Name: Victor Calhoun MRN: [id number] Date of Birth: 03-Jun-1956  Today's Date: 04/22/2016 PT Individual Time: 1035-1104 PT Individual Time Calculation (min): 29 min   Short Term Goals: Week 1:  PT Short Term Goal 1 (Week 1): =LTG due to estimated LOS  Skilled Therapeutic Interventions/Progress Updates:     interpretor present throughout treatment.  Patient received sitting EOB and agreeable to PT. Patient performed gait training in hall for 143ft x 3 with supervision A from PT for increased safety while avoiding obstacles. Gait training through obstacle course with weach through 8 cones, step over 1-6inch bolsters and up/down 12 steps with 1 UE support, PT provided supervision A and min cues for increased step height to avoid obstacles on floor. PT noted patient to have mild LLE foot drop with gait training as well as wide BOS.   Balance training on airex pad.  Toe taps on 2 cones x 10 each LE, with min A provided by PT for improved weight shift and increased safety to prevent LOB.  Lateral reach with BUE to find 8 playing cards on velcro board, min A from PT provided with lateral lean at end range for improved safety.   Patient returned to room and left sitting EOB with interpretor present.   Therapy Documentation Precautions:  Precautions Precautions: Fall Precaution Comments: decr body awareness and decr sensation Restrictions Weight Bearing Restrictions: No General:   Vital Signs: Therapy Vitals Temp: 97.6 F (36.4 C) Temp Source: Oral Pulse Rate: 86 Resp: 16 BP: 117/78 mmHg Patient Position (if appropriate): Sitting Oxygen Therapy SpO2: 96 % O2 Device: Not Delivered Pain: Pain Assessment Pain Assessment: No/denies pain   See Function Navigator for Current Functional Status.   Therapy/Group: Individual Therapy  Lorie Phenix 04/22/2016, 5:13 PM

## 2016-04-23 ENCOUNTER — Inpatient Hospital Stay (HOSPITAL_COMMUNITY): Payer: Self-pay | Admitting: Occupational Therapy

## 2016-04-23 ENCOUNTER — Inpatient Hospital Stay (HOSPITAL_COMMUNITY): Payer: MEDICAID | Admitting: Speech Pathology

## 2016-04-23 ENCOUNTER — Inpatient Hospital Stay (HOSPITAL_COMMUNITY): Payer: MEDICAID | Admitting: Physical Therapy

## 2016-04-23 NOTE — Progress Notes (Signed)
Physical Therapy Discharge Summary  Patient Details  Name: Victor Calhoun MRN: [id number] Date of Birth: 1956/10/25  Today's Date: 04/23/2016 PT Individual Time: 1300-1425 PT Individual Time Calculation (min): 85 min    Patient has met 11 of 12 long term goals due to improved activity tolerance, improved balance, improved postural control, increased range of motion, ability to compensate for deficits, functional use of  left upper extremity and left lower extremity, improved attention, improved awareness and improved coordination.  Patient to discharge at an ambulatory level Modified Independent.   Patient's care partner unavailable to provide the necessary physical and cognitive assistance at discharge. Recommending   Reasons goals not met: Emergent awareness goal unmet; pt requires S.  Recommendation:  Patient will benefit from ongoing skilled PT services in outpatient setting to continue to advance safe functional mobility, address ongoing impairments in balance, activity tolerance, strength, coordination, NMR L side, safety awareness, L attention, and minimize fall risk.  Equipment: No equipment provided  Reasons for discharge: treatment goals met and discharge from hospital  Patient/family agrees with progress made and goals achieved: Yes  PT Discharge Precautions/Restrictions Precautions Precautions: Fall Precaution Comments: decr body awareness and decr sensation L side Restrictions Weight Bearing Restrictions: No Vital Signs Therapy Vitals Temp: 98.4 F (36.9 C) Temp Source: Oral Pulse Rate: 85 Resp: 16 BP: 117/74 mmHg Patient Position (if appropriate): Sitting Oxygen Therapy SpO2: 98 % O2 Device: Not Delivered Pain Pain Assessment Pain Assessment: No/denies pain Vision/Perception    L inattention Cognition Overall Cognitive Status: Impaired/Different from baseline Arousal/Alertness: Awake/alert Orientation Level: Oriented X4 Attention:  Selective Selective Attention: Impaired Selective Attention Impairment: Functional complex;Verbal complex Memory: Impaired Memory Impairment: Retrieval deficit Awareness: Impaired Awareness Impairment: Intellectual impairment Problem Solving: Impaired Problem Solving Impairment: Functional basic Safety/Judgment: Impaired Comments: left inattention, impulsivity  Sensation Sensation Light Touch: Impaired Detail Light Touch Impaired Details: Impaired LUE;Impaired LLE Proprioception: Impaired Detail Proprioception Impaired Details: Impaired LUE;Impaired LLE Coordination Gross Motor Movements are Fluid and Coordinated: Yes Fine Motor Movements are Fluid and Coordinated: No Coordination and Movement Description: decr rapid alternating movements, decr FMC with and without visual feedback Finger Nose Finger Test: [facility] Motor  Motor Motor: Hemiplegia;Ataxia Motor - Discharge Observations: mild L hemiparesis, ataxia  Mobility Bed Mobility Bed Mobility: Supine to Sit;Sit to Supine Supine to Sit: 6: Modified independent (Device/Increase time) Sit to Supine: 6: Modified independent (Device/Increase time) Transfers Transfers: Yes Sit to Stand: 6: Modified independent (Device/Increase time) Stand to Sit: 6: Modified independent (Device/Increase time) Stand Pivot Transfers: 6: Modified independent (Device/Increase time) Locomotion  Ambulation Ambulation: Yes Ambulation/Gait Assistance: 6: Modified independent (Device/Increase time) Ambulation Distance (Feet): 500 Feet Assistive device: None Gait Gait: Yes Gait Pattern: Within Functional Limits Gait velocity: WFL for age/gender norms High Level Ambulation High Level Ambulation: Direction changes;Head turns Direction Changes: mod I Head Turns: mod I Stairs / Additional Locomotion Stairs: Yes Stairs Assistance: 5: Supervision Stairs Assistance Details: Verbal cues for precautions/safety;Verbal cues for technique Stair Management  Technique: Two rails;Alternating pattern;Forwards Number of Stairs: 24 Height of Stairs: 6 Ramp: 5: Supervision Curb: 5: Supervision  Trunk/Postural Assessment  Cervical Assessment Cervical Assessment: Within Functional Limits Thoracic Assessment Thoracic Assessment: Within Functional Limits Lumbar Assessment Lumbar Assessment: Within Functional Limits Postural Control Protective Responses: improved  Balance Balance Balance Assessed: Yes Standardized Balance Assessment Standardized Balance Assessment: Berg Balance Test Berg Balance Test Sit to Stand: Able to stand without using hands and stabilize independently Standing Unsupported: Able to stand safely 2 minutes Sitting with Back Unsupported  but Feet Supported on Floor or Stool: Able to sit safely and securely 2 minutes Stand to Sit: Sits safely with minimal use of hands Transfers: Able to transfer safely, minor use of hands Standing Unsupported with Eyes Closed: Able to stand 10 seconds safely Standing Ubsupported with Feet Together: Able to place feet together independently and stand 1 minute safely From Standing, Reach Forward with Outstretched Arm: Can reach confidently >25 cm (10") From Standing Position, Pick up Object from Floor: Able to pick up shoe safely and easily From Standing Position, Turn to Look Behind Over each Shoulder: Looks behind from both sides and weight shifts well Turn 360 Degrees: Able to turn 360 degrees safely in 4 seconds or less Standing Unsupported, Alternately Place Feet on Step/Stool: Able to stand independently and safely and complete 8 steps in 20 seconds Standing Unsupported, One Foot in Front: Able to place foot tandem independently and hold 30 seconds Standing on One Leg: Able to lift leg independently and hold equal to or more than 3 seconds Total Score: 54 Dynamic Gait Index Level Surface: Normal Change in Gait Speed: Normal Gait with Horizontal Head Turns: Normal Gait with Vertical Head  Turns: Normal Gait and Pivot Turn: Normal Step Over Obstacle: Normal Step Around Obstacles: Normal Steps: Normal Total Score: 24 Static Sitting Balance Static Sitting - Balance Support: No upper extremity supported;Feet supported Static Sitting - Level of Assistance: 7: Independent Dynamic Sitting Balance Dynamic Sitting - Balance Support: No upper extremity supported;Feet supported Dynamic Sitting - Level of Assistance: 6: Modified independent (Device/Increase time) Dynamic Sitting - Balance Activities: Forward lean/weight shifting;Reaching for weighted objects;Reaching across midline Static Standing Balance Static Standing - Balance Support: During functional activity;No upper extremity supported Static Standing - Level of Assistance: 6: Modified independent (Device/Increase time) Static Stance: Eyes closed Static Stance: Eyes Closed: modI x30 sec Static Stance: on Foam: modI x30 sec Static Stance: on Foam, Eyes Closed: modI x30 sec Dynamic Standing Balance Dynamic Standing - Balance Support: During functional activity Dynamic Standing - Level of Assistance: 6: Modified independent (Device/Increase time) Dynamic Standing - Balance Activities: Forward lean/weight shifting;Reaching across midline;Reaching for weighted objects Extremity Assessment  RUE Assessment RUE Assessment: Within Functional Limits LUE Assessment LUE Assessment:  (4/5; WFL tone decr propreception and sensation) RLE Assessment RLE Assessment: Within Functional Limits LLE Assessment LLE Assessment: Within Functional Limits (pt reports subjective weakness, WFL MMT 4+/5 to 5/5 throughout)   See Function Navigator for Current Functional Status.  Benjiman Core Tygielski 04/23/2016, 4:13 PM

## 2016-04-23 NOTE — Progress Notes (Addendum)
Occupational Therapy Discharge Summary  Patient Details  Name: Victor Calhoun MRN: [id number] Date of Birth: 02-26-1956  Today's Date: 04/23/2016 OT Individual Time: 1030-1130 OT Individual Time Calculation (min): 60 min   GRAD DAY!! 1:1 self care retraining at shower level down in ADL apartment using the tub shower (as pt will at home).  Pt able to gather clothing with extra time and carry it down to the apartment. Pt still requires A with pathfinding to locate ADL apartment (despite being there multiple times before). Pt with difficulty generalizing his regular routine to a different environment today - of being in the apartment ot shower.  Decr attempts to request assistance if did not know something (ie if the [facility] over the commode could be removed- or ask for assistance with water controls). Pt able to shower sit to standing and step over high threshold to get in/out of shower. Pt required more than reasonable time to complete routine today but was able to complete mod I.  Continued to provide education about driving and work was not recommended at this time- until f/u with doctor.  Pt verbalized understanding but anticipate this pt not to recall these safety recommendations due to decr awareness of deficits and impact on his safety.   Interpreter present for session.    Patient has met 11 of 13 long term goals due to improved activity tolerance, improved balance, postural control, ability to compensate for deficits, functional use of  LEFT upper and LEFT lower extremity, improved attention, improved awareness and improved coordination.  Patient to discharge at overall mod I for complete simple routine ADLs (including toileting, transfers, bathing and dressing); however still requiring supervision for managing ALL IADLs including driving, working, paying bills, medicine management etc. Level. Pt continues to present with decr attention, problem solving and awareness of deficits that have an impact  on his decision making and safety. Recommend pt to have supervision for all tasks other than BADLs however  Patient's care partner unavailable to provide the necessary cognitive assistance at discharge; nor will there be support for this pt 24/7 as recommended.  No person has been present for an opportunity to education.  Reasons goals not met: Pt still requires A for emergent awareness (currently min A for intellectual awareness) and for supervision for laundry tasks.  Recommendation:  Patient will benefit from ongoing skilled OT services in home health setting to continue to advance functional skills in the area of BADL, iADL and Vocation.  Equipment: No equipment provided  Reasons for discharge: treatment goals met and discharge from hospital  Patient/family agrees with progress made and goals achieved: Yes  OT Discharge Precautions/Restrictions  Precautions Precautions: Fall Precaution Comments: decr body awareness and decr sensation L side Restrictions Weight Bearing Restrictions: No General   Vital Signs Therapy Vitals Temp: 98.4 F (36.9 C) Temp Source: Oral Pulse Rate: 85 Resp: 16 BP: 117/74 mmHg Patient Position (if appropriate): Sitting Oxygen Therapy SpO2: 98 % O2 Device: Not Delivered Pain Pain Assessment Pain Assessment: No/denies pain ADL ADL ADL Comments: see functional navigator Vision/Perception  Vision- History Baseline Vision/History: Wears glasses Wears Glasses: Reading only Patient Visual Report: No change from baseline  Cognition Overall Cognitive Status: Impaired/Different from baseline Arousal/Alertness: Awake/alert Orientation Level: Oriented X4 Attention: Selective Selective Attention: Impaired Selective Attention Impairment: Functional complex;Verbal complex Memory: Impaired Memory Impairment: Retrieval deficit Awareness: Impaired Awareness Impairment: Intellectual impairment Problem Solving: Impaired Problem Solving Impairment:  Functional basic Safety/Judgment: Impaired Comments: left inattention, impulsivity  Sensation Sensation Light Touch:  Impaired Detail Light Touch Impaired Details: Impaired LUE;Impaired LLE Proprioception: Impaired Detail Proprioception Impaired Details: Impaired LUE;Impaired LLE Coordination Gross Motor Movements are Fluid and Coordinated: Yes Fine Motor Movements are Fluid and Coordinated: No Coordination and Movement Description: decr rapid alternating movements, decr FMC with and without visual feedback Finger Nose Finger Test: [facility] Motor  Motor Motor: Hemiplegia;Ataxia Motor - Discharge Observations: mild L hemiparesis, ataxia Mobility  Bed Mobility Bed Mobility: Supine to Sit;Sit to Supine Supine to Sit: 6: Modified independent (Device/Increase time) Sit to Supine: 6: Modified independent (Device/Increase time) Transfers Sit to Stand: 6: Modified independent (Device/Increase time) Stand to Sit: 6: Modified independent (Device/Increase time)  Trunk/Postural Assessment  Cervical Assessment Cervical Assessment: Within Functional Limits Thoracic Assessment Thoracic Assessment: Within Functional Limits Lumbar Assessment Lumbar Assessment: Within Functional Limits Postural Control Protective Responses: improved  Balance Static Standing Balance Static Standing - Level of Assistance: 6: Modified independent (Device/Increase time) Dynamic Standing Balance Dynamic Standing - Level of Assistance: 6: Modified independent (Device/Increase time) Extremity/Trunk Assessment RUE Assessment RUE Assessment: Within Functional Limits LUE Assessment LUE Assessment:  (4/5; WFL tone decr propreception and sensation)   See Function Navigator for Current Functional Status.  Willeen Cass [facility] 04/23/2016, 3:11 PM

## 2016-04-23 NOTE — Plan of Care (Signed)
Problem: RH Awareness Goal: LTG: Patient will demonstrate intellectual/emergent (PT) LTG: Patient will demonstrate intellectual/emergent/anticipatory awareness with assist during a mobility activity (PT)  Outcome: Not Met (add Reason) Requires S

## 2016-04-23 NOTE — Progress Notes (Signed)
Social Work Patient ID: Victor Calhoun, male   DOB: 03/23/1956, 60 y.o.   MRN: [id number] Met with pt with interpreter to discuss recommendation of 24 hr supervision, he is unsure if his family or friends will provide. Have left message for girlfriend who has just had surgery but no call back yet. Pt has friends but they are working, pt wants to know when he can return to work, suppose to ask MD. This worker does not have alternatives to offer due to uninsured and supervision level. Will discuss when Niece here tomorrow Match program and [facility] for medical follow up. Will try to get home health via [facility] through the charity care program. Pt feels he will be fine and will have enough people checking on him at  Discharge.

## 2016-04-23 NOTE — Progress Notes (Signed)
Subjective/Complaints: DIscussed D/C issues , need for supervision.  Bilingual RN assisted ROS- no pain , no SOB or breathing problems   Objective: Vital Signs: Blood pressure 117/74, pulse 74, temperature 97.5 F (36.4 C), temperature source Oral, resp. rate 18, height 5' 6"  (1.676 m), weight 68.675 kg (151 lb 6.4 oz), SpO2 95 %. No results found. Results for orders placed or performed during the hospital encounter of 04/15/16 (from the past 72 hour(s))  Creatinine, serum     Status: Abnormal   Collection Time: 04/22/16  5:37 AM  Result Value Ref Range   Creatinine, Ser 1.40 (H) 0.61 - 1.24 mg/dL   GFR calc non Af Amer 54 (L) >60 mL/min   GFR calc Af Amer >60 >60 mL/min    Comment: (NOTE) The eGFR has been calculated using the CKD EPI equation. This calculation has not been validated in all clinical situations. eGFR's persistently <60 mL/min signify possible Chronic Kidney Disease.      HEENT: poor dentition Cardio: RRR and no murmur Resp: CTA B/L and unlabored GI: BS positive and NT, ND Extremity:  Pulses positive and No Edema Skin:   Other vitiligo Neuro: Alert/Oriented, Abnormal Sensory reduced in LUE and LLE, Abnormal Motor 4/5 in LUE , 5/5 in RUE and in LEs, Abnormal FMC Ataxic/ dec FMC and Dysarthric Musc/Skel:  Other no pain with UE or LE ROM Gen NAD   Assessment/Plan: 1. Functional deficits secondary to  right thalamus and right hippocampus infarct, Left hemisensory deficits with ataxia which require 3+ hours per day of interdisciplinary therapy in a comprehensive inpatient rehab setting. Physiatrist is providing close team supervision and 24 hour management of active medical problems listed below. Physiatrist and rehab team continue to assess barriers to discharge/monitor patient progress toward functional and medical goals. FIM: Function - Bathing Position: Shower Body parts bathed by patient: Right arm, Left arm, Chest, Abdomen, Front perineal area, Buttocks,  Right upper leg, Left upper leg, Right lower leg, Left lower leg, Back Body parts bathed by helper: Back Assist Level: Set up Set up : To obtain items  Function- Upper Body Dressing/Undressing What is the patient wearing?: Pull over shirt/dress Pull over shirt/dress - Perfomed by patient: Thread/unthread right sleeve, Thread/unthread left sleeve, Put head through opening, Pull shirt over trunk Assist Level: More than reasonable time Set up : To obtain clothing/put away Function - Lower Body Dressing/Undressing What is the patient wearing?: Socks, Shoes Position:  (standing in bathroom) Underwear - Performed by patient: Thread/unthread right underwear leg, Thread/unthread left underwear leg, Pull underwear up/down Pants- Performed by patient: Thread/unthread right pants leg, Thread/unthread left pants leg, Pull pants up/down Non-skid slipper socks- Performed by patient: Don/doff right sock, Don/doff left sock Socks - Performed by patient: Don/doff right sock, Don/doff left sock Shoes - Performed by patient: Don/doff right shoe, Don/doff left shoe, Fasten right, Fasten left TED Hose - Performed by helper: Don/doff right TED hose, Don/doff left TED hose Assist for footwear: Independent Assist for lower body dressing: More than reasonable time  Function - Toileting Toileting steps completed by patient: Adjust clothing prior to toileting, Performs perineal hygiene, Adjust clothing after toileting Toileting Assistive Devices: Grab bar or rail Assist level: Supervision or verbal cues  Function - Air cabin crew transfer assistive device: Grab bar Assist level to toilet: No Help, no cues, assistive device, takes more than a reasonable amount of time Assist level from toilet: No Help, no cues, assistive device, takes more than a reasonable amount of time  Function - Chair/bed transfer Chair/bed transfer method: Ambulatory Chair/bed transfer assist level: No Help, no cues, assistive  device, takes more than a reasonable amount of time Chair/bed transfer details: Verbal cues for precautions/safety, Verbal cues for safe use of DME/AE  Function - Locomotion: Wheelchair Will patient use wheelchair at discharge?: No Function - Locomotion: Ambulation Assistive device: No device Max distance: 150 Assist level: Supervision or verbal cues Assist level: No help, No cues, assistive device, takes more than a reasonable amount of time Assist level: Supervision or verbal cues Assist level: Supervision or verbal cues Assist level: Supervision or verbal cues  Function - Comprehension Comprehension: Auditory Comprehension assist level: Understands basic 90% of the time/cues < 10% of the time  Function - Expression Expression: Verbal Expression assist level: Expresses basic 90% of the time/requires cueing < 10% of the time.  Function - Social Interaction Social Interaction assist level: Interacts appropriately 90% of the time - Needs monitoring or encouragement for participation or interaction.  Function - Problem Solving Problem solving assist level: Solves complex 90% of the time/cues < 10% of the time  Function - Memory Memory assist level: Recognizes or recalls 90% of the time/requires cueing < 10% of the time Patient normally able to recall (first 3 days only): Current season   Medical Problem List and Plan: 1.  Left side weakness and dizziness secondary to right thalamus and right hippocampus infarct, R PCA occlusion- Plan d/c in am 2.  DVT Prophylaxis/Anticoagulation: Subcutaneous Lovenox. Monitor platelet counts of any signs of bleeding 3. Pain Management: Fioricet for vascular headaches,Resolved on topamax 79m BID  4. Hypertension. Lopressor 25 mg twice a day, BP well controlled, little on low side but HR is at goal range Filed Vitals:   04/22/16 2012 04/23/16 0411  BP: 136/67 117/74  Pulse: 84 74  Temp:  97.5 F (36.4 C)  Resp:  18   5. Neuropsych: This  patient is capable of making decisions on his own behalf. 6. Skin/Wound Care: Routine skin checks 7. Fluids/Electrolytes/Nutrition: Routine I&O with follow-up chemistries 8. Hyperlipidemia. Lipitor 9. Tobacco abuse. Counseling  LOS (Days) 8 A FACE TO FACE EVALUATION WAS PERFORMED  KIRSTEINS,ANDREW E 04/23/2016, 8:13 AM

## 2016-04-23 NOTE — Plan of Care (Signed)
Problem: RH Awareness Goal: LTG: Patient will demonstrate intellectual/emergent (SLP) LTG: Patient will demonstrate intellectual/emergent/anticipatory awareness with assist during a cognitive/linguistic activity (SLP)  Outcome: Not Met (add Reason) Min assist for intellectual awareness of deficits      

## 2016-04-23 NOTE — Progress Notes (Signed)
Speech Language Pathology Discharge Summary  Patient Details  Name: Victor Calhoun MRN: [id number] Date of Birth: 06-Feb-1956  Today's Date: 04/23/2016 SLP Individual Time: 0900-1000 SLP Individual Time Calculation (min): 60 min   Skilled Therapeutic Interventions:  Pt was seen for skilled ST targeting cognitive goals.  SLP administered the MoCA-Basic standardized cognitive assessment to measure progress from initial evaluation.  Pt scored a 23 out of 30 (n>/= 26).  Pt demonstrated improvements in delayed recall of information and selective attention but continues to demonstrate executive function deficits, safety awareness, and intellectual awareness of deficits.  Pt will likely not have 24/7 supervision at discharge despite recommendations.  SLP is concerned about pt's ability to manage his medications and finances at home given cognitive deficits.  SLP provided pt with a written list of his currently scheduled medications and discussed function of medications as well as the importance of taking medications as prescribed.  Pt verbalized understanding. Pt was returned      Patient has met 3 of 4 long term goals.  Patient to discharge at overall Min;Supervision level.  Reasons goals not met:  Pt currently requires min assist for intellectual awareness of deficits   Clinical Impression/Discharge Summary:  Pt made functional gains and is discharging having met 3 out of 4 long term goals.  Pt is currently an overall min assist-supervision level for basic to semi-complex familiar tasks.  Pt presents with left inattention, poor intellectual awareness of deficits, decreased functional problem solving, impaired recall of information, and poor safety awareness.   Pt is discharging home without questionable intermittent supervision although team strongly recommends that he have 24.7 supervision due to cognitive deficits.  No family education has been completed as no family members have been present while  pt has been on CIR.  Pt would also benefit from ST follow up at next level of care.    Care Partner:  Caregiver Able to Provide Assistance: Other (comment) (girlfriend just had surgery, roommate in [place], no family present during pt's stay on CIR )     Recommendation:  24 hour supervision/assistance;Home Health SLP;Outpatient SLP  Rationale for SLP Follow Up: Reduce caregiver burden;Maximize cognitive function and independence   Equipment: none recommended by SLP    Reasons for discharge: Discharged from hospital   Patient/Family Agrees with Progress Made and Goals Achieved: Yes   Function:  Eating Eating                 Cognition Comprehension Comprehension assist level: Follows basic conversation/direction with no assist  Expression   Expression assist level: Expresses basic needs/ideas: With no assist  Social Interaction Social Interaction assist level: Interacts appropriately with others with medication or extra time (anti-anxiety, antidepressant).  Problem Solving Problem solving assist level: Solves basic 90% of the time/requires cueing < 10% of the time  Memory Memory assist level: Recognizes or recalls 75 - 89% of the time/requires cueing 10 - 24% of the time   Emilio Math 04/23/2016, 12:19 PM

## 2016-04-24 MED ORDER — TOPIRAMATE 25 MG PO TABS: 25.0000 mg | ORAL_TABLET | Freq: Two times a day (BID) | ORAL | Status: AC

## 2016-04-24 MED ORDER — ATORVASTATIN CALCIUM 40 MG PO TABS: 40.0000 mg | ORAL_TABLET | Freq: Every day | ORAL | Status: AC

## 2016-04-24 MED ORDER — METOPROLOL TARTRATE 25 MG PO TABS: 25.0000 mg | ORAL_TABLET | Freq: Two times a day (BID) | ORAL | Status: AC

## 2016-04-24 NOTE — Progress Notes (Signed)
Social Work  Discharge Note  The overall goal for the admission was met for:   Discharge location: Yes-HOME WITH INTERMITTENT ASSIST FROM FAMILY AND FRIENDS  Length of Stay: Yes-9 DAYS  Discharge activity level: Yes-SUPERVISION LEVEL FOR COGNITIVE   Home/community participation: Yes  Services provided included: MD, RD, PT, OT, SLP, RN, CM, TR, Pharmacy and SW  Financial Services: Other: SELF PAY  Follow-up services arranged: Home Health: [facility]-PT,OT,SP,RN,SW and Patient/Family has no preference for HH/DME agencies  Comments (or additional information):TEAM RECOMMENDS 24 HR SUPERVISION PT WILL NOT HAVE THIS AT HOME, HE IS UNSURE WHO WILL BE COMING IN AND OUT HE FEELS HE HAS EXTENDED FAMILY-NIECES & NEPHEWS AND FRIENDS WHO WILL BE CHECKING ON HIM. PT UNAWARE OF HIS DEFICITS AND FEELS HE CAN DRIVE AND RETURN TO WORK. MD TO ADDRESS. PT IS NOT ELIGIBLE FOR MANY SERVICES DUE TO HIS SELF PAY STATUS. MATCH GIVEN TO NIECE FOR ASSISTANCE WITH MEDICATIONS AND GIVEN  PAPERWORK ON [facility] FOR MEDICAL FOLLOW UP. UNSURE HOW MUCH PT WILL FOLLOW UP WITH THIS. HE IS HIGH RISK TO BE RE-ADMITTED TO THE HOSPITAL DUE TO LACK OF AWARENESS OF HIS DEFICITS.  Patient/Family verbalized understanding of follow-up arrangements: Yes  Individual responsible for coordination of the follow-up plan: SELF & KELLY-GIRLFREIND?  Confirmed correct DME delivered: Elease Hashimoto 04/24/2016    Elease Hashimoto

## 2016-04-24 NOTE — Progress Notes (Signed)
Subjective/Complaints: Appreciate SW note, pt up at sink standing and brushing dentures ROS- no pain , no SOB or breathing problems   Objective: Vital Signs: Blood pressure 100/71, pulse 65, temperature 97.8 F (36.6 C), temperature source Oral, resp. rate 16, height 5' 6"  (1.676 m), weight 68.675 kg (151 lb 6.4 oz), SpO2 97 %. No results found. Results for orders placed or performed during the hospital encounter of 04/15/16 (from the past 72 hour(s))  Creatinine, serum     Status: Abnormal   Collection Time: 04/22/16  5:37 AM  Result Value Ref Range   Creatinine, Ser 1.40 (H) 0.61 - 1.24 mg/dL   GFR calc non Af Amer 54 (L) >60 mL/min   GFR calc Af Amer >60 >60 mL/min    Comment: (NOTE) The eGFR has been calculated using the CKD EPI equation. This calculation has not been validated in all clinical situations. eGFR's persistently <60 mL/min signify possible Chronic Kidney Disease.      HEENT: poor dentition Cardio: RRR and no murmur Resp: CTA B/L and unlabored GI: BS positive and NT, ND Extremity:  Pulses positive and No Edema Skin:   Other vitiligo Neuro: Alert/Oriented, Abnormal Sensory reduced in LUE and LLE, Abnormal Motor 4/5 in LUE , 5/5 in RUE and in LEs, Abnormal FMC Ataxic/ dec FMC and Dysarthric Musc/Skel:  Other no pain with UE or LE ROM Gen NAD   Assessment/Plan: 1. Functional deficits secondary to  right thalamus and right hippocampus infarct, Left hemisensory deficits with ataxia  Stable for D/C today F/u PCP in 3-4 weeks F/u PM&R 2 weeks, no work or driving until I re eval, see  D/C summary See D/C instructionsFIM: Function - Bathing Position: Shower Body parts bathed by patient: Right arm, Left arm, Chest, Abdomen, Front perineal area, Buttocks, Right upper leg, Left upper leg, Right lower leg, Left lower leg, Back Body parts bathed by helper: Back Assist Level: More than reasonable time Set up : To obtain items  Function- Upper Body  Dressing/Undressing What is the patient wearing?: Pull over shirt/dress Pull over shirt/dress - Perfomed by patient: Thread/unthread right sleeve, Thread/unthread left sleeve, Put head through opening, Pull shirt over trunk Assist Level: More than reasonable time Set up : To obtain clothing/put away Function - Lower Body Dressing/Undressing What is the patient wearing?: Socks, Shoes, Underwear, Pants Position:  (standing in bathroom) Underwear - Performed by patient: Thread/unthread right underwear leg, Thread/unthread left underwear leg, Pull underwear up/down Pants- Performed by patient: Thread/unthread right pants leg, Thread/unthread left pants leg, Pull pants up/down, Fasten/unfasten pants Non-skid slipper socks- Performed by patient: Don/doff right sock, Don/doff left sock Socks - Performed by patient: Don/doff right sock, Don/doff left sock Shoes - Performed by patient: Don/doff right shoe, Don/doff left shoe, Fasten right, Fasten left TED Hose - Performed by helper: Don/doff right TED hose, Don/doff left TED hose Assist for footwear: Independent Assist for lower body dressing: More than reasonable time  Function - Toileting Toileting steps completed by patient: Adjust clothing prior to toileting, Performs perineal hygiene, Adjust clothing after toileting Toileting Assistive Devices: Grab bar or rail Assist level: More than reasonable time  Function - Air cabin crew transfer assistive device: Grab bar Assist level to toilet: No Help, no cues, assistive device, takes more than a reasonable amount of time Assist level from toilet: No Help, no cues, assistive device, takes more than a reasonable amount of time  Function - Chair/bed transfer Chair/bed transfer method: Ambulatory Chair/bed transfer assist level: No  Help, no cues, assistive device, takes more than a reasonable amount of time Chair/bed transfer details: Verbal cues for precautions/safety, Verbal cues for safe  use of DME/AE  Function - Locomotion: Wheelchair Will patient use wheelchair at discharge?: No Function - Locomotion: Ambulation Assistive device: No device Max distance: 500 Assist level: No help, No cues, assistive device, takes more than a reasonable amount of time Assist level: No help, No cues, assistive device, takes more than a reasonable amount of time Assist level: No help, No cues, assistive device, takes more than a reasonable amount of time Assist level: No help, No cues, assistive device, takes more than a reasonable amount of time Assist level: No help, No cues, assistive device, takes more than a reasonable amount of time  Function - Comprehension Comprehension: Auditory Comprehension assist level: Understands basic 90% of the time/cues < 10% of the time  Function - Expression Expression: Verbal Expression assist level: Expresses basic 90% of the time/requires cueing < 10% of the time.  Function - Social Interaction Social Interaction assist level: Interacts appropriately 90% of the time - Needs monitoring or encouragement for participation or interaction.  Function - Problem Solving Problem solving assist level: Solves complex 90% of the time/cues < 10% of the time  Function - Memory Memory assist level: Recognizes or recalls 90% of the time/requires cueing < 10% of the time Patient normally able to recall (first 3 days only): None of the above   Medical Problem List and Plan: 1.  Left side weakness and dizziness secondary to right thalamus and right hippocampus infarct, R PCA occlusion- Plan d/c today 2.  DVT Prophylaxis/Anticoagulation: Subcutaneous Lovenox. Monitor platelet counts of any signs of bleeding 3. Pain Management: Fioricet for vascular headaches,Resolved on topamax 66m BID  4. Hypertension. Lopressor 25 mg twice a day, BP well controlled, little on low side but HR is at goal range Filed Vitals:   04/23/16 1955 04/24/16 0511  BP: 115/84 100/71   Pulse: 81 65  Temp:  97.8 F (36.6 C)  Resp:  16   5. Neuropsych: This patient is capable of making decisions on his own behalf. 6. Skin/Wound Care: Routine skin checks 7. Fluids/Electrolytes/Nutrition: Routine I&O with follow-up chemistries 8. Hyperlipidemia. Lipitor 9. Tobacco abuse. Counseling  LOS (Days) 9 A FACE TO FACE EVALUATION WAS PERFORMED  Victor Calhoun 04/24/2016, 8:19 AM

## 2016-04-24 NOTE — Progress Notes (Signed)
Social Work Patient ID: Victor Calhoun, male   DOB: October 04, 1956, 60 y.o.   MRN: [id number] Never heard back from girlfriend regarding concerns about pt going home today. Niece to come at 10:00 to transport pt home. Will have PA and myself go over instructions and services.

## 2016-04-24 NOTE — Discharge Instructions (Signed)
Inpatient Rehab Discharge Instructions  Shigeto Schram Discharge date and time: No discharge date for patient encounter.   Activities/Precautions/ Functional Status: Activity: activity as tolerated Diet: regular diet Wound Care: none needed Functional status:  ___ No restrictions     ___ Walk up steps independently ___ 24/7 supervision/assistance   ___ Walk up steps with assistance ___ Intermittent supervision/assistance  ___ Bathe/dress independently ___ Walk with walker     _x STROKE/TIA DISCHARGE INSTRUCTIONS SMOKING Cigarette smoking nearly doubles your risk of having a stroke & is the single most alterable risk factor  If you smoke or have smoked in the last 12 months, you are advised to quit smoking for your health.  Most of the excess cardiovascular risk related to smoking disappears within a year of stopping.  Ask you doctor about anti-smoking medications  [place] Quit Line: 1-800-QUIT NOW  Free Smoking Cessation Classes [phone]  CHOLESTEROL Know your levels; limit fat & cholesterol in your diet  Lipid Panel     Component Value Date/Time   CHOL 165 04/14/2016 0535   TRIG 725* 04/14/2016 0535   HDL 26* 04/14/2016 0535   CHOLHDL 6.3 04/14/2016 0535   VLDL UNABLE TO CALCULATE IF TRIGLYCERIDE OVER 400 mg/dL 04/14/2016 0535   LDLCALC UNABLE TO CALCULATE IF TRIGLYCERIDE OVER 400 mg/dL 04/14/2016 0535      Many patients benefit from treatment even if their cholesterol is at goal.  Goal: Total Cholesterol (CHOL) less than 160  Goal:  Triglycerides (TRIG) less than 150  Goal:  HDL greater than 40  Goal:  LDL (LDLCALC) less than 100   BLOOD PRESSURE American Stroke Association blood pressure target is less that 120/80 mm/Hg  Your discharge blood pressure is:  BP: 106/73 mmHg  Monitor your blood pressure  Limit your salt and alcohol intake  Many individuals will require more than one medication for high blood pressure  DIABETES (A1c is a blood sugar average for  last 3 months) Goal HGBA1c is under 7% (HBGA1c is blood sugar average for last 3 months)  Diabetes: No known diagnosis of diabetes    Lab Results  Component Value Date   HGBA1C 5.7 04/14/2016     Your HGBA1c can be lowered with medications, healthy diet, and exercise.  Check your blood sugar as directed by your physician  Call your physician if you experience unexplained or low blood sugars.  PHYSICAL ACTIVITY/REHABILITATION Goal is 30 minutes at least 4 days per week  Activity: Increase activity slowly, Therapies: Physical Therapy: Home Health Return to work:   Activity decreases your risk of heart attack and stroke and makes your heart stronger.  It helps control your weight and blood pressure; helps you relax and can improve your mood.  Participate in a regular exercise program.  Talk with your doctor about the best form of exercise for you (dancing, walking, swimming, cycling).  DIET/WEIGHT Goal is to maintain a healthy weight  Your discharge diet is: Diet 2 gram sodium Room service appropriate?: Yes; Fluid consistency:: Thin  liquids Your height is:  Height: 5\' 6"  (167.6 cm) Your current weight is: Weight: 71.714 kg (158 lb 1.6 oz) Your Body Mass Index (BMI) is:  BMI (Calculated): 25.6  Following the type of diet specifically designed for you will help prevent another stroke.  Your goal weight range is:    Your goal Body Mass Index (BMI) is 19-24.  Healthy food habits can help reduce 3 risk factors for stroke:  High cholesterol, hypertension, and excess  weight.  RESOURCES Stroke/Support Group:  Call [phone]   STROKE EDUCATION PROVIDED/REVIEWED AND GIVEN TO PATIENT Stroke warning signs and symptoms How to activate emergency medical system (call 911). Medications prescribed at discharge. Need for follow-up after discharge. Personal risk factors for stroke. Pneumonia vaccine given:  Flu vaccine given:  My questions have been answered, the writing is legible, and I  understand these instructions.  I will adhere to these goals & educational materials that have been provided to me after my discharge from the hospital.    __ Bathe/dress with assistance ___ Walk Independently    ___ Shower independently ___ Walk with assistance    ___ Shower with assistance ___ No alcohol     ___ Return to work/school ________  Special Instructions: No driving no smoking or alcohol   COMMUNITY REFERRALS UPON DISCHARGE:    Home Health:   PT, OT,SPT, RN  Agency:[facility] [place] [place]   Date of last service:04/24/2016  Medical Equipment/Items Ordered:NONE NEEDED   Other:[facility]-FOLLOW UP MEDICAL CARE  GENERAL COMMUNITY RESOURCES FOR PATIENT/FAMILY: Employment Assistance:[facility]  [place] [address] [phone]    My questions have been answered and I understand these instructions. I will adhere to these goals and the provided educational materials after my discharge from the hospital.  Patient/Caregiver Signature _______________________________ Date __________  Clinician Signature _______________________________________ Date __________  Please bring this form and your medication list with you to all your follow-up doctor's appointments.

## 2016-04-24 NOTE — Progress Notes (Signed)
Patient and family received discharge instructions from Marlowe Shores, PA-C with the aid of an interpretor with verbal understanding. Patient discharged to home with family and belongings.

## 2016-04-28 ENCOUNTER — Telehealth: Payer: Self-pay | Admitting: Physical Medicine & Rehabilitation

## 2016-04-28 NOTE — Telephone Encounter (Addendum)
Left message for patient to call back  F/U appointment made for May 11 at 12:30pm

## 2016-04-30 NOTE — Telephone Encounter (Signed)
Call # 1  04/28/2016 10:37 AM: Left message for patient to call back     Patient is non-english speaking so call can not be completed.  1. Are you/is patient experiencing any problems since coming home? Are there any questions regarding any aspect of care? 2. Are there any questions regarding medications administration/dosing? Are meds being taken as prescribed? Patient should review meds with caller to confirm 3. Have there been any falls? 4. Has Home Health been to the house and/or have they contacted you? If not, have you tried to contact them? Can we help you contact them? 5. Are bowels and bladder emptying properly? Are there any unexpected incontinence issues? If applicable, is patient following bowel/bladder programs? 6. Any fevers, problems with breathing, unexpected pain? 7. Are there any skin problems or new areas of breakdown? 8. Has the patient/family member arranged specialty MD follow up (ie cardiology/neurology/renal/surgical/etc)?  Can we help arrange? 9. Does the patient need any other services or support that we can help arrange? 10. Are caregivers following through as expected in assisting the patient?  F/U appointment made for May 11 at 12:30pm

## 2016-04-30 NOTE — Telephone Encounter (Signed)
Victor Calhoun Please send patient a packet with an appointment card paper clipped inside. Also highlight on the envelope that an appointment card is enclosed. Please also notify the interpreter services that we need someone to call and tell him of his appointment. Please and thank you.

## 2016-05-07 ENCOUNTER — Encounter: Payer: Self-pay | Attending: Physical Medicine & Rehabilitation

## 2016-05-07 ENCOUNTER — Encounter: Payer: Self-pay | Admitting: Physical Medicine & Rehabilitation

## 2016-05-07 ENCOUNTER — Ambulatory Visit (HOSPITAL_BASED_OUTPATIENT_CLINIC_OR_DEPARTMENT_OTHER): Payer: Self-pay | Admitting: Physical Medicine & Rehabilitation

## 2016-05-07 VITALS — BP 126/74 | HR 79 | Resp 14

## 2016-05-07 DIAGNOSIS — F17200 Nicotine dependence, unspecified, uncomplicated: Secondary | ICD-10-CM | POA: Insufficient documentation

## 2016-05-07 DIAGNOSIS — G44309 Post-traumatic headache, unspecified, not intractable: Secondary | ICD-10-CM | POA: Insufficient documentation

## 2016-05-07 DIAGNOSIS — I10 Essential (primary) hypertension: Secondary | ICD-10-CM | POA: Insufficient documentation

## 2016-05-07 DIAGNOSIS — Z5189 Encounter for other specified aftercare: Secondary | ICD-10-CM | POA: Insufficient documentation

## 2016-05-07 DIAGNOSIS — I69398 Other sequelae of cerebral infarction: Secondary | ICD-10-CM

## 2016-05-07 DIAGNOSIS — R42 Dizziness and giddiness: Secondary | ICD-10-CM | POA: Insufficient documentation

## 2016-05-07 DIAGNOSIS — R269 Unspecified abnormalities of gait and mobility: Secondary | ICD-10-CM

## 2016-05-07 DIAGNOSIS — R531 Weakness: Secondary | ICD-10-CM | POA: Insufficient documentation

## 2016-05-07 DIAGNOSIS — R208 Other disturbances of skin sensation: Secondary | ICD-10-CM | POA: Insufficient documentation

## 2016-05-07 DIAGNOSIS — E785 Hyperlipidemia, unspecified: Secondary | ICD-10-CM | POA: Insufficient documentation

## 2016-05-07 DIAGNOSIS — I69359 Hemiplegia and hemiparesis following cerebral infarction affecting unspecified side: Secondary | ICD-10-CM

## 2016-05-07 NOTE — Patient Instructions (Addendum)
No work until okay with MD  May drive but not at night or on [place]. If you're going longer distances please have somebody in the car with you  My nurse will give use names of doctors in [place] that you may call to make an appointment

## 2016-05-07 NOTE — Progress Notes (Signed)
Subjective:  Transitional care visit  Patient ID: Victor Calhoun, male    DOB: November 10, 1956, 60 y.o.   MRN: [id number] 60 year old right-handed non- English-speaking male from [place] with no significant past medical history except tobacco abuse.  By chart review, he lives alone.  He does have a roommate.  He does have a girlfriend, who can assist, bu awaiting surgery.  One-level apartment with level entry.  The patient independent prior to admission, working Architect.  Presented on April 13, 2016, with left-sided weakness and dizziness.  Initial cranial CT scan negative.  MRI of the brain showed diffusion abnormality, right thalamus and throughout right hippocampal formation.  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 CIR admission  DATE OF ADMISSION:  04/15/2016 DATE OF DISCHARGE:  04/24/2016  HPI Patient has returned to home. He is dressing and bathing himself. He is ambulating without an assistive device. He lives with his girlfriend. He does not have a primary care physician. Patient has driven around the neighborhood. He did not do so with permission. He has not gone back to work. 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 No falls, no new medical problems  Patient is here with a interpreter Pain Inventory Average Pain 2 Pain Right Now NA My pain is tingling and aching  In the last 24 hours, has pain interfered with the following? General activity 3 Relation with others 2 Enjoyment of life 3 What TIME of day is your pain at its worst? evening Sleep (in general) Good  Pain is worse with: some activites Pain improves with: pacing activities Relief from Meds: NA  Mobility walk without assistance how many minutes can you walk? 15 ability to climb steps?  yes do you drive?  yes transfers alone Do you have any goals in this area?  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  no CT/MRI  Physicians involved in your care Neurologist .   Family History  Problem Relation Age of Onset  . Stroke Neg Hx    Social History   Social History  . Marital Status: Single    Spouse Name: N/A  . Number of Children: N/A  . Years of Education: N/A   Social History Main Topics  . Smoking status: Current Some Day Smoker  . Smokeless tobacco: None  . Alcohol Use: Yes  . Drug Use: No  . Sexual Activity: Not Asked   Other Topics Concern  . None   Social History Narrative   Past Surgical History  Procedure Laterality Date  . No past surgeries     Past Medical History  Diagnosis Date  . Medical history non-contributory   . Stroke ([place])    BP 126/74 mmHg  Pulse 79  Resp 14  SpO2 96%  Opioid Risk Score:   Fall Risk Score:  `1  Depression screen PHQ 2/9  Depression screen PHQ 2/9 05/07/2016  Decreased Interest 0  Down, Depressed, Hopeless 3  PHQ - 2 Score 3  Altered sleeping 2  Tired, decreased energy 2  Change in appetite 0  Feeling bad or failure about yourself  1  Trouble concentrating 0  Moving slowly or fidgety/restless 0  Suicidal thoughts 0  PHQ-9 Score 8  Difficult doing work/chores Not difficult at  all     Review of Systems  Neurological: Positive for numbness.       Tingling   All other systems reviewed and are negative.      Objective:   Physical Exam  Constitutional: He is oriented to person, place, and time. He appears well-developed and well-nourished.  HENT:  Head: Normocephalic and atraumatic.  Right Ear: External ear normal.  Left Ear: External ear normal.  Eyes: Conjunctivae and EOM are normal. Pupils are equal, round, and reactive to light.  Evidence of nystagmus, visual fields are intact to confrontation testing  Neck:  Normal range of motion. Neck supple.  Cardiovascular: Normal rate, regular rhythm, normal heart sounds and intact distal pulses.   Pulmonary/Chest: Effort normal and breath sounds normal.  Abdominal: Soft. Bowel sounds are normal.  Musculoskeletal: Normal range of motion.  Neurological: He is alert and oriented to person, place, and time. He displays no atrophy and no tremor. A sensory deficit is present. He exhibits normal muscle tone. Coordination and gait abnormal.  Decreased sensation in the left hand and arm No evidence of facial droop Motor strength is 4+ in the left deltoid, biceps, triceps, grip 5/5 in the right deltoid, biceps, triceps, grip 5/5 in bilateral hip flexor knee extensor and ankle dorsi flexor  Decreased finger to thumb opposition in the left hand Positive dysdiadochokinesis in the left upper extremity alternating supination and pronation  Able to do standard gait but not tandem gait  Psychiatric: He has a normal mood and affect.  Nursing note and vitals reviewed.         Assessment & Plan:  Medical Problem List and Plan: 1.  Left side weakness and dizziness secondary to right thalamus and right hyppocampal -formation CVA,Home health therapy recommended, limited benefits due to no insurance Return to clinic one month to reassess his ability to go back to work. He has a physical job in Architect May drive limited distances, no nighttime driving no Interstate driving. We'll need  A passenger to help navigate further from home 2. Pain Management: Topamax 25 mg twice a day for posttraumatic headache 3. Hypertension. Lopressor 25 mg twice a day, Needs primary care physician have given him names and numbers of primary care physicians in [place] 4. Hyperlipidemia. Lipitor, Follow-up primary care physician as above. Patient has another month supply of his medications. 5. Tobacco abuse. Counseling

## 2016-05-08 DIAGNOSIS — I69328 Other speech and language deficits following cerebral infarction: Secondary | ICD-10-CM

## 2016-05-08 DIAGNOSIS — I69354 Hemiplegia and hemiparesis following cerebral infarction affecting left non-dominant side: Secondary | ICD-10-CM

## 2016-05-08 DIAGNOSIS — I82409 Acute embolism and thrombosis of unspecified deep veins of unspecified lower extremity: Secondary | ICD-10-CM

## 2016-05-08 DIAGNOSIS — I10 Essential (primary) hypertension: Secondary | ICD-10-CM

## 2016-05-14 ENCOUNTER — Telehealth: Payer: Self-pay | Admitting: *Deleted

## 2016-05-14 NOTE — Telephone Encounter (Signed)
Speech therapist asks for verbal order to continue speech therapy for 1 more visit.  Verbal order given per office protocol

## 2016-05-29 ENCOUNTER — Encounter: Payer: Self-pay | Attending: Physical Medicine & Rehabilitation

## 2016-05-29 ENCOUNTER — Ambulatory Visit: Payer: Self-pay | Admitting: Physical Medicine & Rehabilitation

## 2016-05-29 DIAGNOSIS — R531 Weakness: Secondary | ICD-10-CM | POA: Insufficient documentation

## 2016-05-29 DIAGNOSIS — G44309 Post-traumatic headache, unspecified, not intractable: Secondary | ICD-10-CM | POA: Insufficient documentation

## 2016-05-29 DIAGNOSIS — F17200 Nicotine dependence, unspecified, uncomplicated: Secondary | ICD-10-CM | POA: Insufficient documentation

## 2016-05-29 DIAGNOSIS — I69398 Other sequelae of cerebral infarction: Secondary | ICD-10-CM | POA: Insufficient documentation

## 2016-05-29 DIAGNOSIS — R269 Unspecified abnormalities of gait and mobility: Secondary | ICD-10-CM | POA: Insufficient documentation

## 2016-05-29 DIAGNOSIS — I10 Essential (primary) hypertension: Secondary | ICD-10-CM | POA: Insufficient documentation

## 2016-05-29 DIAGNOSIS — E785 Hyperlipidemia, unspecified: Secondary | ICD-10-CM | POA: Insufficient documentation

## 2016-05-29 DIAGNOSIS — R208 Other disturbances of skin sensation: Secondary | ICD-10-CM | POA: Insufficient documentation

## 2016-05-29 DIAGNOSIS — R42 Dizziness and giddiness: Secondary | ICD-10-CM | POA: Insufficient documentation

## 2016-05-29 DIAGNOSIS — Z5189 Encounter for other specified aftercare: Secondary | ICD-10-CM | POA: Insufficient documentation

## 2016-07-01 ENCOUNTER — Encounter: Payer: Self-pay | Admitting: *Deleted

## 2016-07-01 ENCOUNTER — Emergency Department
Admission: EM | Admit: 2016-07-01 | Discharge: 2016-07-01 | Disposition: A | Discharge status: Home / Self Care | Payer: Self-pay | Attending: Emergency Medicine | Admitting: Emergency Medicine

## 2016-07-01 DIAGNOSIS — Z8673 Personal history of transient ischemic attack (TIA), and cerebral infarction without residual deficits: Secondary | ICD-10-CM | POA: Insufficient documentation

## 2016-07-01 DIAGNOSIS — F17210 Nicotine dependence, cigarettes, uncomplicated: Secondary | ICD-10-CM | POA: Insufficient documentation

## 2016-07-01 DIAGNOSIS — Z760 Encounter for issue of repeat prescription: Secondary | ICD-10-CM | POA: Insufficient documentation

## 2016-07-01 DIAGNOSIS — I10 Essential (primary) hypertension: Secondary | ICD-10-CM | POA: Insufficient documentation

## 2016-07-01 DIAGNOSIS — Z7982 Long term (current) use of aspirin: Secondary | ICD-10-CM | POA: Insufficient documentation

## 2016-07-01 DIAGNOSIS — E785 Hyperlipidemia, unspecified: Secondary | ICD-10-CM | POA: Insufficient documentation

## 2016-07-01 HISTORY — DX: Essential (primary) hypertension: I10

## 2016-07-01 HISTORY — DX: Hyperlipidemia, unspecified: E78.5

## 2016-07-01 MED ORDER — METOPROLOL TARTRATE 25 MG PO TABS: 25.0000 mg | ORAL_TABLET | Freq: Two times a day (BID) | ORAL | Status: DC

## 2016-07-01 MED ORDER — ATORVASTATIN CALCIUM 40 MG PO TABS: 40.0000 mg | ORAL_TABLET | Freq: Every day | ORAL | Status: DC

## 2016-07-01 MED ORDER — TOPIRAMATE 50 MG PO TABS: 50.0000 mg | ORAL_TABLET | Freq: Two times a day (BID) | ORAL | Status: DC

## 2016-07-01 NOTE — Discharge Instructions (Signed)
Reposición de medicamentos en el servicio de emergencias °(Medicine Refill at the Emergency Department) °Hoy, hemos repuesto su medicamento, pero le recomendamos obtener las reposiciones en el consultorio de su médico de cabecera. En el futuro, planifique las reposiciones de modo que no necesite solicitarlas en el servicio de emergencias. °Si el medicamento repuesto fue de mantenimiento, es posible que haya recibido solo lo suficiente para que le alcance hasta que vea nuevamente a su médico de cabecera. °  °Esta información no tiene como fin reemplazar el consejo del médico. Asegúrese de hacerle al médico cualquier pregunta que tenga. °  °Document Released: 03/22/2008 Document Revised: 01/04/2015 °Elsevier Interactive Patient Education ©2016 [employer] ° °

## 2016-07-01 NOTE — ED Provider Notes (Signed)
[facility] Emergency Department Provider Note   ____________________________________________  Time seen: Approximately 8:14 AM  I have reviewed the triage vital signs and the nursing notes.   HISTORY  Chief Complaint Medication Refill    HPI Victor Calhoun is a 60 y.o. male patient recently discharged from rehabilitation is post stroke. Patient presents with request for refill of maintenance medications. Patient is given with 3 days emergency supply by pharmacy Pending reestablishing contact with his family care doctor.Patient denies any pain at this time.   Past Medical History  Diagnosis Date  . Medical history non-contributory   . Stroke ([place])   . Hypertension   . Hyperlipemia     Patient Active Problem List   Diagnosis Date Noted  . Hemiparesis and alteration of sensations as late effects of stroke ([place]) 05/07/2016  . Gait disturbance, post-stroke 05/07/2016  . Altered sensation due to stroke 04/22/2016  . CVA (cerebral vascular accident) ([place]) 04/15/2016  . Hemiparesis ([place])   . Other vascular headache   . Benign essential HTN   . HLD (hyperlipidemia)   . Tobacco abuse   . Acute CVA (cerebrovascular accident) ([place]) 04/14/2016  . TIA (transient ischemic attack) 04/13/2016    Past Surgical History  Procedure Laterality Date  . No past surgeries      Current Outpatient Rx  Name  Route  Sig  Dispense  Refill  . aspirin 325 MG tablet   Oral   Take 1 tablet (325 mg total) by mouth daily.   30 tablet   1   . atorvastatin (LIPITOR) 40 MG tablet   Oral   Take 1 tablet (40 mg total) by mouth daily at 6 PM.   30 tablet   1   . atorvastatin (LIPITOR) 40 MG tablet   Oral   Take 1 tablet (40 mg total) by mouth daily.   30 tablet   0   . metoprolol tartrate (LOPRESSOR) 25 MG tablet   Oral   Take 1 tablet (25 mg total) by mouth 2 (two) times daily.   60 tablet   1   . metoprolol tartrate (LOPRESSOR) 25 MG tablet   Oral   Take 1 tablet (25 mg total) by mouth 2 (two) times daily.   60 tablet   0   . topiramate (TOPAMAX) 25 MG tablet   Oral   Take 1 tablet (25 mg total) by mouth 2 (two) times daily.   60 tablet   1   . topiramate (TOPAMAX) 50 MG tablet   Oral   Take 1 tablet (50 mg total) by mouth 2 (two) times daily.   60 tablet   0     Allergies Review of patient's allergies indicates no known allergies.  Family History  Problem Relation Age of Onset  . Stroke Neg Hx     Social History Social History  Substance Use Topics  . Smoking status: Current Some Day Smoker    Types: Cigarettes  . Smokeless tobacco: None  . Alcohol Use: Yes    Review of Systems Constitutional: No fever/chills Eyes: No visual changes. ENT: No sore throat. Cardiovascular: Denies chest pain. Respiratory: Denies shortness of breath. Gastrointestinal: No abdominal pain.  No nausea, no vomiting.  No diarrhea.  No constipation. Genitourinary: Negative for dysuria. Musculoskeletal: Negative for back pain. Skin: Negative for rash. Neurological: Negative for headaches, focal weakness or numbness. Endocrine: Hyperlipidemia and hypertension Hematological/Lymphatic: Allergic/Immunilogical: **} 10-point ROS otherwise negative.  ____________________________________________   PHYSICAL EXAM:  VITAL SIGNS: ED Triage Vitals  Enc Vitals Group     BP 07/01/16 0759 133/88 mmHg     Pulse Rate 07/01/16 0759 59     Resp 07/01/16 0759 18     Temp 07/01/16 0759 97.5 F (36.4 C)     Temp Source 07/01/16 0759 Oral     SpO2 07/01/16 0759 95 %     Weight 07/01/16 0759 170 lb (77.111 kg)     Height 07/01/16 0759 5\' 9"  (1.753 m)     Head Cir --      Peak Flow --      Pain Score --      Pain Loc --      Pain Edu? --      Excl. in [place]? --     Constitutional: Alert and oriented. Well appearing and in no acute distress. Eyes: Conjunctivae are normal. PERRL. EOMI. Head: Atraumatic. Nose: No  congestion/rhinnorhea. Mouth/Throat: Mucous membranes are moist.  Oropharynx non-erythematous. Neck: No stridor.  No cervical spine tenderness to palpation.  Hematological/Lymphatic/Immunilogical: No cervical lymphadenopathy. Cardiovascular: Normal rate, regular rhythm. Grossly normal heart sounds.  Good peripheral circulation. Respiratory: Normal respiratory effort.  No retractions. Lungs CTAB. Gastrointestinal: Soft and nontender. No distention. No abdominal bruits. No CVA tenderness. usculoskeletal: No lower extremity tenderness nor edema.  No joint effusions. Neurologic:  Normal speech and language. No gross focal neurologic deficits are appreciated. No gait instability. Skin:  Skin is warm, dry and intact. No rash noted. Psychiatric: Mood and affect are normal. Speech and behavior are normal.  ____________________________________________   LABS (all labs ordered are listed, but only abnormal results are displayed)  Labs Reviewed - No data to display ____________________________________________  EKG   ____________________________________________  RADIOLOGY   ____________________________________________   PROCEDURES  Procedure(s) performed: None  Procedures  Critical Care performed: No  ____________________________________________   INITIAL IMPRESSION / ASSESSMENT AND PLAN / ED COURSE  Pertinent labs & imaging results that were available during my care of the patient were reviewed by me and considered in my medical decision making (see chart for details).  Encounter for medication refill. Patient given a one-month supply of medication consisting of Lipitor, Lopressor, and Topamax. Advised to establish care with family doctor. ____________________________________________   FINAL CLINICAL IMPRESSION(S) / ED DIAGNOSES  Final diagnoses:  Encounter for medication refill      NEW MEDICATIONS STARTED DURING THIS VISIT:  New Prescriptions   ATORVASTATIN  (LIPITOR) 40 MG TABLET    Take 1 tablet (40 mg total) by mouth daily.   METOPROLOL TARTRATE (LOPRESSOR) 25 MG TABLET    Take 1 tablet (25 mg total) by mouth 2 (two) times daily.   TOPIRAMATE (TOPAMAX) 50 MG TABLET    Take 1 tablet (50 mg total) by mouth 2 (two) times daily.     Note:  This document was prepared using Dragon voice recognition software and may include unintentional dictation errors.    Sable Feil, PA-C 07/01/16 0818  Rudene Re, MD 07/01/16 2053

## 2016-07-01 NOTE — ED Notes (Signed)
Pt recently discharge from rehabilitation center post stroke and having a hard time getting into the clinic needs refills on medication. Pill bottles at bedside.

## 2016-07-01 NOTE — ED Notes (Signed)
Pt has been unable to get medications refilled: toprol, Topamax and Lipitor, pt is here for medication refill

## 2016-08-01 ENCOUNTER — Encounter (HOSPITAL_COMMUNITY): Payer: Self-pay | Admitting: Emergency Medicine

## 2016-08-01 ENCOUNTER — Emergency Department (HOSPITAL_COMMUNITY)
Admission: EM | Admit: 2016-08-01 | Discharge: 2016-08-01 | Disposition: A | Discharge status: Home / Self Care | Payer: Self-pay | Attending: Physician Assistant | Admitting: Physician Assistant

## 2016-08-01 DIAGNOSIS — F17210 Nicotine dependence, cigarettes, uncomplicated: Secondary | ICD-10-CM | POA: Insufficient documentation

## 2016-08-01 DIAGNOSIS — Z7982 Long term (current) use of aspirin: Secondary | ICD-10-CM | POA: Insufficient documentation

## 2016-08-01 DIAGNOSIS — Z760 Encounter for issue of repeat prescription: Secondary | ICD-10-CM | POA: Insufficient documentation

## 2016-08-01 DIAGNOSIS — I10 Essential (primary) hypertension: Secondary | ICD-10-CM | POA: Insufficient documentation

## 2016-08-01 MED ORDER — ATORVASTATIN CALCIUM 40 MG PO TABS: 40.0000 mg | ORAL_TABLET | Freq: Every day | ORAL | 0 refills | Status: AC

## 2016-08-01 MED ORDER — METOPROLOL TARTRATE 25 MG PO TABS: 25.0000 mg | ORAL_TABLET | Freq: Two times a day (BID) | ORAL | 0 refills | Status: AC

## 2016-08-01 MED ORDER — TOPIRAMATE 50 MG PO TABS: 50.0000 mg | ORAL_TABLET | Freq: Two times a day (BID) | ORAL | 0 refills | Status: AC

## 2016-08-01 NOTE — Care Management Note (Signed)
Case Management Note  Patient Details  Name: Victor Calhoun MRN: [id number] Date of Birth: 06-Feb-1956  Subjective/Objective:      HTN              Action/Plan: Discharge Planning: AVS reviewed:  Provided pt with coupons for goodrx and discount cards. Lopressor $4.00 list at [employer]. Lipitor, $ 23.91. [facility] offers the best price at goodrx for Topomax at $24.30. Pt states he can afford these prices. Pt will call the [facility] in [place] to arrange follow up appt. Pt utilized [facility] in 03/2016.  Expected Discharge Date:  08/01/2016            Expected Discharge Plan:  Home/Self Care  In-House Referral:  NA  Discharge planning Services  CM Consult, Medication Assistance, [facility]  Post Acute Care Choice:  NA Choice offered to:  NA  DME Arranged:  N/A DME Agency:  NA  HH Arranged:  NA HH Agency:  NA  Status of Service:  Completed, signed off  If discussed at [place] of Stay Meetings, dates discussed:    Additional Comments:  Erenest Rasher, RN 08/01/2016, 1:42 PM

## 2016-08-01 NOTE — ED Notes (Signed)
Bed: WA27 Expected date:  Expected time:  Means of arrival:  Comments: 

## 2016-08-01 NOTE — Discharge Instructions (Signed)
Read the information below.  Use the prescribed medication as directed.  Please discuss all new medications with your pharmacist.  You may return to the Emergency Department at any time for worsening condition or any new symptoms that concern you.    Lea la informacin a continuacin. Use la medicacin prescrita como se indica. Por favor discuta todos los nuevos medicamentos con su farmacutico. Usted puede regresar al Departamento de Emergencias en cualquier momento por empeoramiento de la condicin o cualquier nuevo sntoma que le afecte.

## 2016-08-01 NOTE — Progress Notes (Addendum)
Pt c/o left shoulder pain with movement times one week . He states the pain is a 1/10. Pt speaks limited english. The interpreter machine remains at the bedside. (12:30pm)case manager provided the pt with coupons to ge this medications. (2pm)Pt repeated instructions in spanish.

## 2016-08-01 NOTE — ED Provider Notes (Signed)
[place] DEPT Provider Note    First Provider Contact:   First MD Initiated Contact with Patient 08/01/16 1237     By signing my name below, I, Bea Graff, attest that this documentation has been prepared under the direction and in the presence of [facility], PA-C. Electronically Signed: Bea Graff, ED Scribe. 08/01/16. 1:01 PM.  History   Chief Complaint Chief Complaint  Patient presents with  . Medication Refill   HPI  HPI Comments:  Victor Calhoun is a 60 y.o. male with PMHx of HLD, HTN and CVA who presents to the Emergency Department needing refills on his Topamax, Lopressor and Lipitor. Pt had a stroke and was admitted from 04/15/16-04/24/16 and is being followed by Dr. Alysia Penna. Pt states he will be out of his medications today and needs refills. He states his medications are expensive and has a difficult time paying for them because they are over $100 per month. He states he has gone back to work already. He reports some continued left sided weakness but states he feels normal other than that. He denies any new weakness, numbness or tingling of any extremity, nausea, vomiting, fever or chills.   Past Medical History:  Diagnosis Date  . Hyperlipemia   . Hypertension   . Medical history non-contributory   . Stroke [facility])     Patient Active Problem List   Diagnosis Date Noted  . Hemiparesis and alteration of sensations as late effects of stroke ([place]) 05/07/2016  . Gait disturbance, post-stroke 05/07/2016  . Altered sensation due to stroke 04/22/2016  . CVA (cerebral vascular accident) ([place]) 04/15/2016  . Hemiparesis ([place])   . Other vascular headache   . Benign essential HTN   . HLD (hyperlipidemia)   . Tobacco abuse   . Acute CVA (cerebrovascular accident) ([place]) 04/14/2016  . TIA (transient ischemic attack) 04/13/2016    Past Surgical History:  Procedure Laterality Date  . NO PAST SURGERIES       Home Medications    Prior to Admission  medications   Medication Sig Start Date End Date Taking? Authorizing Provider  aspirin 325 MG tablet Take 1 tablet (325 mg total) by mouth daily. 04/15/16   Vaughan Basta, MD  atorvastatin (LIPITOR) 40 MG tablet Take 1 tablet (40 mg total) by mouth daily at 6 PM. 04/24/16   Lavon Paganini Angiulli, PA-C  atorvastatin (LIPITOR) 40 MG tablet Take 1 tablet (40 mg total) by mouth daily. 07/01/16 07/01/17  Sable Feil, PA-C  metoprolol tartrate (LOPRESSOR) 25 MG tablet Take 1 tablet (25 mg total) by mouth 2 (two) times daily. 04/24/16   Lavon Paganini Angiulli, PA-C  metoprolol tartrate (LOPRESSOR) 25 MG tablet Take 1 tablet (25 mg total) by mouth 2 (two) times daily. 07/01/16 07/01/17  Sable Feil, PA-C  topiramate (TOPAMAX) 25 MG tablet Take 1 tablet (25 mg total) by mouth 2 (two) times daily. 04/24/16   Lavon Paganini Angiulli, PA-C  topiramate (TOPAMAX) 50 MG tablet Take 1 tablet (50 mg total) by mouth 2 (two) times daily. 07/01/16 07/01/17  Sable Feil, PA-C    Family History Family History  Problem Relation Age of Onset  . Stroke Neg Hx     Social History Social History  Substance Use Topics  . Smoking status: Current Some Day Smoker    Types: Cigarettes  . Smokeless tobacco: Never Used  . Alcohol use Yes     Allergies   Review of patient's allergies indicates no known allergies.  Review of Systems Review of Systems  Constitutional: Negative for chills and fever.  Gastrointestinal: Negative for nausea and vomiting.  Neurological: Negative for weakness and numbness.     Physical Exam Updated Vital Signs BP 121/74 (BP Location: Left Arm)   Pulse 69   Temp 97.6 F (36.4 C) (Oral)   Resp 18   Ht 5\' 7"  (1.702 m)   Wt 165 lb (74.8 kg)   SpO2 98%   BMI 25.84 kg/m   Physical Exam  Constitutional: He appears well-developed and well-nourished. No distress.  HENT:  Head: Normocephalic and atraumatic.  Neck: Neck supple.  Pulmonary/Chest: Effort normal.  Neurological: He is alert.    Skin: He is not diaphoretic.  Psychiatric: He has a normal mood and affect. His behavior is normal.  Nursing note and vitals reviewed.    ED Treatments / Results  Labs (all labs ordered are listed, but only abnormal results are displayed) Labs Reviewed - No data to display  EKG  EKG Interpretation None       Radiology No results found.  Procedures Procedures (including critical care time)  Medications Ordered in ED Medications - No data to display   Initial Impression / Assessment and Plan / ED Course  I have reviewed the triage vital signs and the nursing notes.  Pertinent labs & imaging results that were available during my care of the patient were reviewed by me and considered in my medical decision making (see chart for details).  Clinical Course  DIAGNOSTIC STUDIES: Oxygen Saturation is 98% on RA, normal by my interpretation.   COORDINATION OF CARE: 1:00 PM- Will consult case manager. Pt verbalizes understanding and agrees to plan.  Medications - No data to display   Afebrile, nontoxic patient with need for refill of medications. He did not miss any doses.  Denies any new or concerning symptoms.  Given PCP follow up in [facility]) and coupons from Tourist information centre manager for discounts.  Pt states he will be able to afford the medications at the reduced prices.   D/C home with refills.   Discussed result, findings, treatment, and follow up  with patient.  Pt given return precautions.  Pt verbalizes understanding and agrees with plan.       I personally performed the services described in this documentation, which was scribed in my presence. The recorded information has been reviewed and is accurate.   Final Clinical Impressions(s) / ED Diagnoses   Final diagnoses:  Medication refill    New Prescriptions Discharge Medication List as of 08/01/2016  1:40 PM       Clayton Bibles, PA-C 08/01/16 [place], MD 08/01/16 1823

## 2016-08-01 NOTE — ED Triage Notes (Signed)
Patient reports history of CVA two weeks ago. States he needs refills on Metoprolol, Topiramate, Atorvastatin. Reports running out of medications yesterday. Denies having PCP. Patient c/o left shoulder pain with mobility x1 week. A&O x4[name] [phone]

## 2016-08-19 ENCOUNTER — Emergency Department
Admission: EM | Admit: 2016-08-19 | Discharge: 2016-08-20 | Disposition: A | Discharge status: Home / Self Care | Payer: Self-pay | Attending: Emergency Medicine | Admitting: Emergency Medicine

## 2016-08-19 ENCOUNTER — Encounter: Payer: Self-pay | Admitting: Emergency Medicine

## 2016-08-19 ENCOUNTER — Emergency Department: Payer: Self-pay

## 2016-08-19 DIAGNOSIS — M5412 Radiculopathy, cervical region: Secondary | ICD-10-CM | POA: Insufficient documentation

## 2016-08-19 DIAGNOSIS — Z7982 Long term (current) use of aspirin: Secondary | ICD-10-CM | POA: Insufficient documentation

## 2016-08-19 DIAGNOSIS — E876 Hypokalemia: Secondary | ICD-10-CM | POA: Insufficient documentation

## 2016-08-19 DIAGNOSIS — Z791 Long term (current) use of non-steroidal anti-inflammatories (NSAID): Secondary | ICD-10-CM | POA: Insufficient documentation

## 2016-08-19 DIAGNOSIS — I10 Essential (primary) hypertension: Secondary | ICD-10-CM | POA: Insufficient documentation

## 2016-08-19 DIAGNOSIS — F17210 Nicotine dependence, cigarettes, uncomplicated: Secondary | ICD-10-CM | POA: Insufficient documentation

## 2016-08-19 DIAGNOSIS — D329 Benign neoplasm of meninges, unspecified: Secondary | ICD-10-CM | POA: Insufficient documentation

## 2016-08-19 DIAGNOSIS — M4802 Spinal stenosis, cervical region: Secondary | ICD-10-CM | POA: Insufficient documentation

## 2016-08-19 LAB — CBC
HCT: 44.3 % (ref 40.0–52.0)
HEMOGLOBIN: 15.2 g/dL (ref 13.0–18.0)
MCH: 31.7 pg (ref 26.0–34.0)
MCHC: 34.2 g/dL (ref 32.0–36.0)
MCV: 92.4 fL (ref 80.0–100.0)
Platelets: 189 10*3/uL (ref 150–440)
RBC: 4.8 MIL/uL (ref 4.40–5.90)
RDW: 13.2 % (ref 11.5–14.5)
WBC: 5.9 10*3/uL (ref 3.8–10.6)

## 2016-08-19 LAB — BASIC METABOLIC PANEL
Anion gap: 8 (ref 5–15)
BUN: 27 mg/dL — ABNORMAL HIGH (ref 6–20)
CHLORIDE: 111 mmol/L (ref 101–111)
CO2: 21 mmol/L — ABNORMAL LOW (ref 22–32)
Calcium: 9.1 mg/dL (ref 8.9–10.3)
Creatinine, Ser: 1.17 mg/dL (ref 0.61–1.24)
GFR calc non Af Amer: >60 mL/min (ref >60)
Glucose, Bld: 100 mg/dL — ABNORMAL HIGH (ref 65–99)
POTASSIUM: 3.3 mmol/L — AB (ref 3.5–5.1)
SODIUM: 140 mmol/L (ref 135–145)

## 2016-08-19 NOTE — ED Notes (Signed)
Assessment done through interpreter at bedside

## 2016-08-19 NOTE — ED Triage Notes (Signed)
Pt ambulatory to triage without difficulty, reports pain to left leg, left arm, left side of back intermittent since stroke back in April.  Pt reports recently has become more painful.  Pt with limited English.  Smile symmetrical, grip even, friend denies slurred speech, pt reports headache.  [place] and bedside to assist with triage.

## 2016-08-20 ENCOUNTER — Emergency Department: Payer: Self-pay

## 2016-08-20 LAB — TROPONIN I

## 2016-08-20 MED ORDER — GADOBENATE DIMEGLUMINE 529 MG/ML IV SOLN
15.0000 mL | Freq: Once | INTRAVENOUS | Status: AC | PRN
  Administered 2016-08-20: 15 mL via INTRAVENOUS

## 2016-08-20 MED ORDER — MORPHINE SULFATE (PF) 4 MG/ML IV SOLN
4.0000 mg | Freq: Once | INTRAVENOUS | Status: AC
  Administered 2016-08-20: 4 mg via INTRAVENOUS
  Filled 2016-08-20: qty 1

## 2016-08-20 MED ORDER — POTASSIUM CHLORIDE CRYS ER 20 MEQ PO TBCR
40.0000 meq | EXTENDED_RELEASE_TABLET | Freq: Once | ORAL | Status: AC
  Administered 2016-08-20: 40 meq via ORAL
  Filled 2016-08-20: qty 2

## 2016-08-20 MED ORDER — METHYLPREDNISOLONE 4 MG PO TBPK: ORAL_TABLET | ORAL | 0 refills | Status: AC

## 2016-08-20 MED ORDER — IBUPROFEN 800 MG PO TABS: 800.0000 mg | ORAL_TABLET | Freq: Three times a day (TID) | ORAL | 0 refills | Status: AC | PRN

## 2016-08-20 MED ORDER — ONDANSETRON HCL 4 MG/2ML IJ SOLN
4.0000 mg | Freq: Once | INTRAMUSCULAR | Status: AC
  Administered 2016-08-20: 4 mg via INTRAVENOUS
  Filled 2016-08-20: qty 2

## 2016-08-20 MED ORDER — SODIUM CHLORIDE 0.9 % IV BOLUS (SEPSIS)
1000.0000 mL | Freq: Once | INTRAVENOUS | Status: AC
  Administered 2016-08-20: 1000 mL via INTRAVENOUS

## 2016-08-20 MED ORDER — HYDROCODONE-ACETAMINOPHEN 5-325 MG PO TABS: 1.0000 | ORAL_TABLET | Freq: Four times a day (QID) | ORAL | 0 refills | Status: AC | PRN

## 2016-08-20 NOTE — Discharge Instructions (Signed)
1. Take steroid taper as prescribed (Medrol dose pack). 2. You may take pain medicines as needed (Motrin/Norco #15). 3. Please call the number above to make an appointment for further evaluation of your benign brain tumor and narrowing of your spine. 4. Return to the ER for worsening symptoms, persistent vomiting, difficulty breathing or other concerns.

## 2016-08-20 NOTE — ED Provider Notes (Signed)
[facility] Emergency Department Provider Note   ____________________________________________   First MD Initiated Contact with Patient 08/20/16 0015     (approximate)  I have reviewed the triage vital signs and the nursing notes.   HISTORY  Chief Complaint Leg Pain and Arm Pain  History obtained via Spanish interpreter  HPI Victor Calhoun is a 60 y.o. male who presents to the ED from home with a chief complaint of left-sided pain, weakness, numbness. Patient has a history of hypertension and CVA who reports a 3 week history of pain to his left shoulder and shoulder blade, left side and left leg pain. Reports repetitive motion at work picking [place]. Patient is right-hand dominant. Also reports he feels like his left side has been weaker compared to his right. Complains of numbness and tingling to his left upper and left lower extremities. Denies bowel or bladder incontinence. Denies headache, slurred speech, vision changes, fever, chills, chest pain, shortness of breath, abdominal pain, nausea, vomiting, diarrhea. Nothing makes his symptoms better. Movement makes his left shoulder pain worse. Denies recent travel or trauma.   Past Medical History:  Diagnosis Date  . Hyperlipemia   . Hypertension   . Medical history non-contributory   . Stroke [facility])     Patient Active Problem List   Diagnosis Date Noted  . Hemiparesis and alteration of sensations as late effects of stroke ([place]) 05/07/2016  . Gait disturbance, post-stroke 05/07/2016  . Altered sensation due to stroke 04/22/2016  . CVA (cerebral vascular accident) ([place]) 04/15/2016  . Hemiparesis ([place])   . Other vascular headache   . Benign essential HTN   . HLD (hyperlipidemia)   . Tobacco abuse   . Acute CVA (cerebrovascular accident) ([place]) 04/14/2016  . TIA (transient ischemic attack) 04/13/2016    Past Surgical History:  Procedure Laterality Date  . NO PAST  SURGERIES      Prior to Admission medications   Medication Sig Start Date End Date Taking? Authorizing Provider  aspirin 325 MG tablet Take 1 tablet (325 mg total) by mouth daily. 04/15/16   Vaughan Basta, MD  atorvastatin (LIPITOR) 40 MG tablet Take 1 tablet (40 mg total) by mouth daily at 6 PM. 04/24/16   Lavon Paganini Angiulli, PA-C  atorvastatin (LIPITOR) 40 MG tablet Take 1 tablet (40 mg total) by mouth daily. 08/01/16 08/01/17  Clayton Bibles, PA-C  HYDROcodone-acetaminophen (NORCO) 5-325 MG tablet Take 1 tablet by mouth every 6 (six) hours as needed for moderate pain. 08/20/16   Paulette Blanch, MD  ibuprofen (ADVIL,MOTRIN) 800 MG tablet Take 1 tablet (800 mg total) by mouth every 8 (eight) hours as needed for moderate pain. 08/20/16   Paulette Blanch, MD  methylPREDNISolone (MEDROL DOSEPAK) 4 MG TBPK tablet Take as directed 08/20/16   Paulette Blanch, MD  metoprolol tartrate (LOPRESSOR) 25 MG tablet Take 1 tablet (25 mg total) by mouth 2 (two) times daily. 04/24/16   Lavon Paganini Angiulli, PA-C  metoprolol tartrate (LOPRESSOR) 25 MG tablet Take 1 tablet (25 mg total) by mouth 2 (two) times daily. 08/01/16 08/01/17  Clayton Bibles, PA-C  topiramate (TOPAMAX) 25 MG tablet Take 1 tablet (25 mg total) by mouth 2 (two) times daily. 04/24/16   Lavon Paganini Angiulli, PA-C  topiramate (TOPAMAX) 50 MG tablet Take 1 tablet (50 mg total) by mouth 2 (two) times daily. 08/01/16 08/01/17  Clayton Bibles, PA-C    Allergies Review of patient's allergies indicates  no known allergies.  Family History  Problem Relation Age of Onset  . Stroke Neg Hx     Social History Social History  Substance Use Topics  . Smoking status: Current Some Day Smoker    Types: Cigarettes  . Smokeless tobacco: Never Used  . Alcohol use Yes    Review of Systems  Constitutional: No fever/chills. Eyes: No visual changes. ENT: No sore throat. Cardiovascular: Denies chest pain. Respiratory: Denies shortness of breath. Gastrointestinal: No abdominal pain.  No  nausea, no vomiting.  No diarrhea.  No constipation. Genitourinary: Negative for dysuria. Musculoskeletal: Negative for back pain. Skin: Negative for rash. Neurological: Positive for left sided weakness, numbness and tingling. Negative for headaches.  10-point ROS otherwise negative.  ____________________________________________   PHYSICAL EXAM:  VITAL SIGNS: ED Triage Vitals  Enc Vitals Group     BP 08/19/16 2032 114/73     Pulse Rate 08/19/16 2032 74     Resp 08/19/16 2032 16     Temp 08/19/16 2032 97.4 F (36.3 C)     Temp Source 08/19/16 2032 Oral     SpO2 08/19/16 2032 95 %     Weight 08/19/16 2032 167 lb (75.8 kg)     Height 08/19/16 2032 5' 8.11" (1.73 m)     Head Circumference --      Peak Flow --      Pain Score 08/19/16 2033 7     Pain Loc --      Pain Edu? --      Excl. in [place]? --     Constitutional: Alert and oriented. Well appearing and in no acute distress. Eyes: Conjunctivae are normal. PERRL. EOMI. Head: Atraumatic. Nose: No congestion/rhinnorhea. Mouth/Throat: Mucous membranes are moist.  Oropharynx non-erythematous. Neck: No stridor.  Midline cervical spine tenderness to palpation.  No carotid bruits. Cardiovascular: Normal rate, regular rhythm. Grossly normal heart sounds.  Good peripheral circulation. Respiratory: Normal respiratory effort.  No retractions. Lungs CTAB. Gastrointestinal: Soft and nontender. No distention. No abdominal bruits. No CVA tenderness. Musculoskeletal: Left shoulder is tender to palpation and with range of motion. No deformities noted. Symmetrical grip strength. No lower extremity tenderness nor edema.  No joint effusions. Neurologic:  Normal speech and language. No gross focal neurologic deficits are appreciated. 5/5 motor strength and sensation. No gait instability. Skin:  Skin is warm, dry and intact. No rash noted. Psychiatric: Mood and affect are normal. Speech and behavior are  normal.  ____________________________________________   LABS (all labs ordered are listed, but only abnormal results are displayed)  Labs Reviewed  BASIC METABOLIC PANEL - Abnormal; Notable for the following:       Result Value   Potassium 3.3 (*)    CO2 21 (*)    Glucose, Bld 100 (*)    BUN 27 (*)    All other components within normal limits  CBC  TROPONIN I   ____________________________________________  EKG  ED ECG REPORT I, SUNG,JADE J, the attending physician, personally viewed and interpreted this ECG.   Date: 08/20/2016  EKG Time: 2040  Rate: 69  Rhythm: normal EKG, normal sinus rhythm  Axis: Normal  Intervals:none  ST&T Change: Nonspecific  ____________________________________________  RADIOLOGY  CT head and interpreted per Dr. Maryland Pink: No evidence of acute intracranial abnormality.    Old right thalamic lacunar infarct    Mild atrophy with small vessel ischemic changes.   Chest 2 view (view by me, interpreted per Dr. Pollyann Kennedy): Negative chest.  MRI brain with and without  contrast interpreted per Dr. Toney Reil: 1. Focus of encephalomalacia in right thalamus and asymmetric  atrophy of right hippocampus compatible with chronic infarction.  2. Dural mass centered over left operculum with tail of dural  thickening over the left convexity is likely a meningioma. Minimal  mass effect on brain. No edema in underlying cortex. No appreciable  osseous invasion.  3. Mild paranasal sinus disease.  4. No evidence of acute infarct or intracranial hemorrhage.   MRI cervical spine with or without contrast interpreted per Dr. Toney Reil: 1. No abnormal cord signal or enhancement.  2. No evidence of diskitis, suspicious bone lesion, or abnormal  enhancement of the cervical spine.  3. Cervical spondylosis greatest at the C5-6 level with disc  osteophyte complex results in mild to moderate bilateral foraminal  narrowing, anterior cord impingement, and moderate canal  stenosis.    ____________________________________________   PROCEDURES  Procedure(s) performed: None  Procedures  Critical Care performed: No  ____________________________________________   INITIAL IMPRESSION / ASSESSMENT AND PLAN / ED COURSE  Pertinent labs & imaging results that were available during my care of the patient were reviewed by me and considered in my medical decision making (see chart for details).  60 year old male with a past history of CVA who presents with a three-week history of left sided pain, numbness and tingling. There are no focal neurological deficits on exam. Some of his symptoms suggestive of cervical radiculopathy. Initial laboratory results and head scan are negative for acute abnormality. Will proceed with MRI brain and cervical spine. IV analgesia ordered for patient's left shoulder pain.  Clinical Course  Comment By Time  Patient improved after morphine administration. Updated patient of MRI results. Will refer to neurosurgery for outpatient follow-up of meningiomas as well as a cervical stenosis. Will place patient on a regimen of NSAIDs, steroids and analgesia. Strict return precautions given. Patient verbalizes understanding and agrees with plan of care. Paulette Blanch, MD 08/24 [phone]     ____________________________________________   FINAL CLINICAL IMPRESSION(S) / ED DIAGNOSES  Final diagnoses:  Cervical radiculopathy  Hypokalemia  Meningioma (HCC)  Stenosis of cervical spine      NEW MEDICATIONS STARTED DURING THIS VISIT:  New Prescriptions   HYDROCODONE-ACETAMINOPHEN (NORCO) 5-325 MG TABLET    Take 1 tablet by mouth every 6 (six) hours as needed for moderate pain.   IBUPROFEN (ADVIL,MOTRIN) 800 MG TABLET    Take 1 tablet (800 mg total) by mouth every 8 (eight) hours as needed for moderate pain.   METHYLPREDNISOLONE (MEDROL DOSEPAK) 4 MG TBPK TABLET    Take as directed     Note:  This document was prepared using Dragon voice  recognition software and may include unintentional dictation errors.    Paulette Blanch, MD 08/20/16 [phone]

## 2016-08-20 NOTE — ED Notes (Signed)
Called MRI tech, screening questions reviewed with patient via video interpreter.

## 2016-08-20 NOTE — ED Notes (Signed)
Pt taken to MRI by Almyra Free, tech.

## 2016-08-20 NOTE — ED Notes (Signed)
Pt returned to room from MRI.

## 2017-02-06 IMAGING — MR MR HEAD WO/W CM
11 of 13 series · 42 of 48 positions shown · IV contrast (multihance)
Comparison: Concurrent cervical MR. 08/19/2016 CT head. Brain MR
04/13/2016.

CLINICAL DATA: 60 y/o M; pain of left leg, arm, and left-sided back
since stroke in [place] and reports of headache.

EXAM:
MRI HEAD WITHOUT AND WITH CONTRAST
TECHNIQUE: Multiplanar, multiecho pulse sequences of the brain and surrounding
structures were obtained without and with intravenous contrast.
CONTRAST:  15mL MULTIHANCE GADOBENATE DIMEGLUMINE 529 MG/ML IV SOLN

[Series 2: GRE · sagittal · 5.0mm · 0.45mm/px · 2 of 25 slices shown (1 of 2)]
[im 1/25]
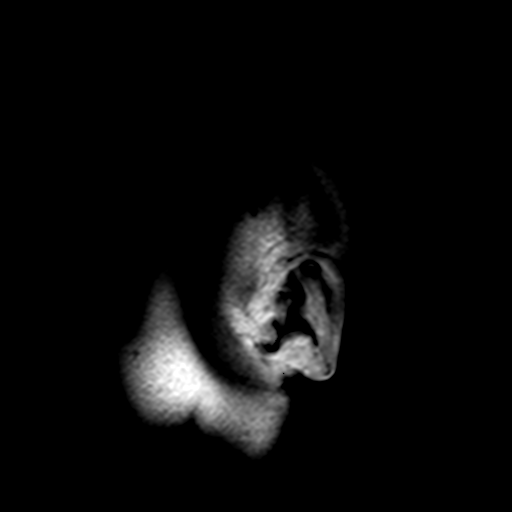
[im 25/25]
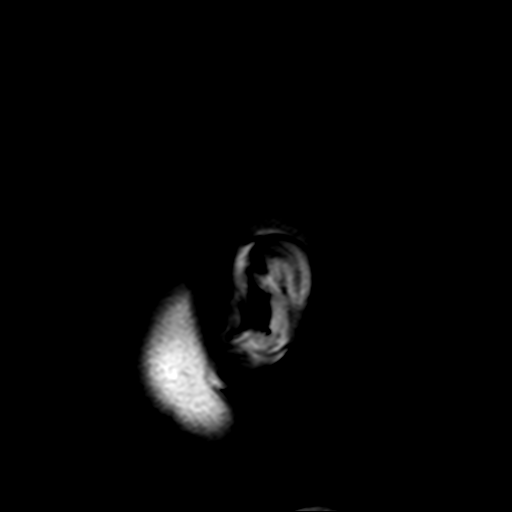

[Series 4: DWI · axial · 3.0mm · 1.80mm/px · z∈[-79,+93]mm · 6 of 59 slices shown (1 of 4)]
[im 1/59]
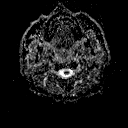
[im 12/59]
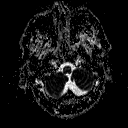
[im 24/59]
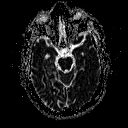
[im 35/59]
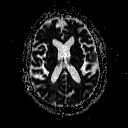
[im 47/59]
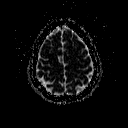
[im 59/59]
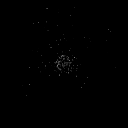

[Series 6: DWI · coronal · 3.0mm · 1.80mm/px · 5 of 49 slices shown (2 of 4)]
[im 1/49]
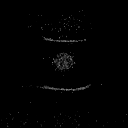
[im 13/49]
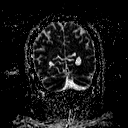
[im 25/49]
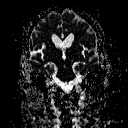
[im 37/49]
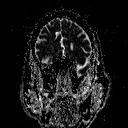
[im 49/49]
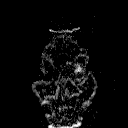

[Series 7: T2 · axial · 5.0mm · 0.45mm/px · z∈[-75,+92]mm · 3 of 27 slices shown (1 of 3)]
[im 1/27]
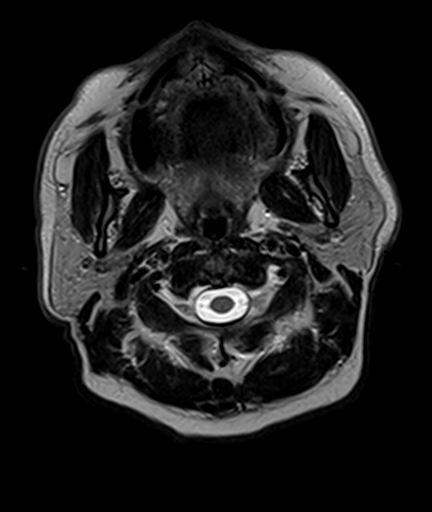
[im 14/27]
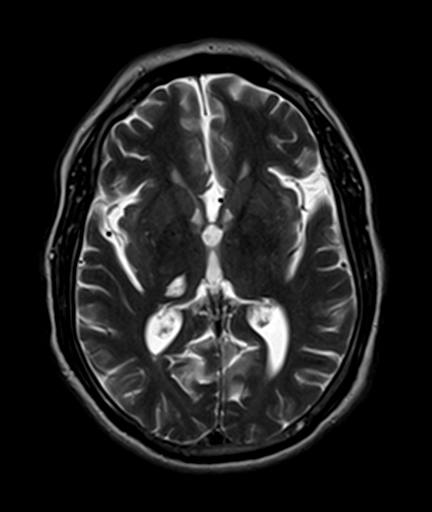
[im 27/27]
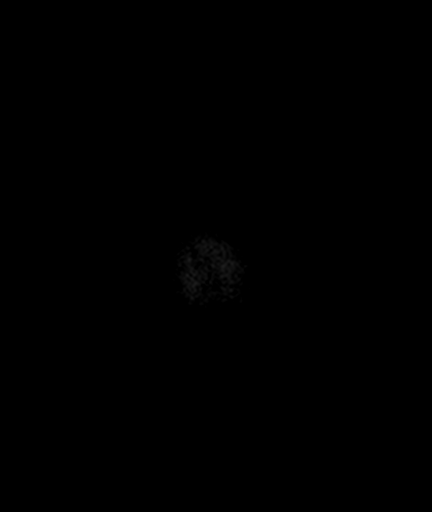

[Series 8: FLAIR · axial · 5.0mm · 0.45mm/px · z∈[-68,+86]mm · 3 of 25 slices shown]
[im 1/25]
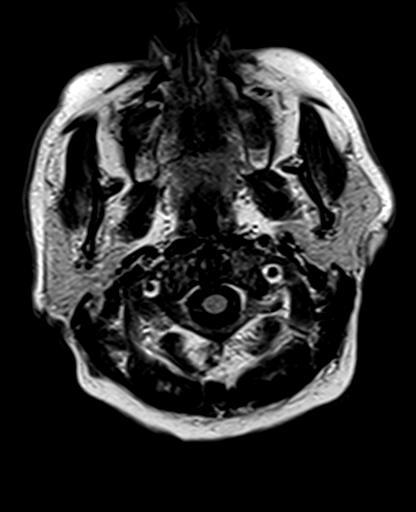
[im 13/25]
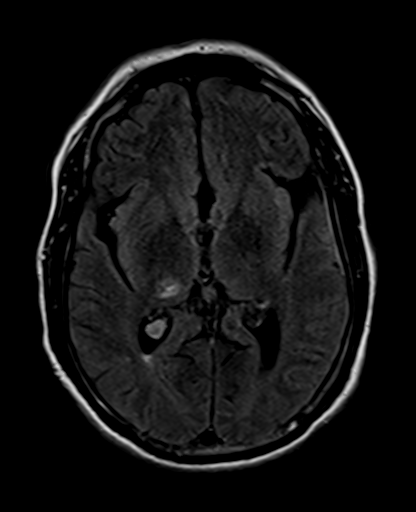
[im 25/25]
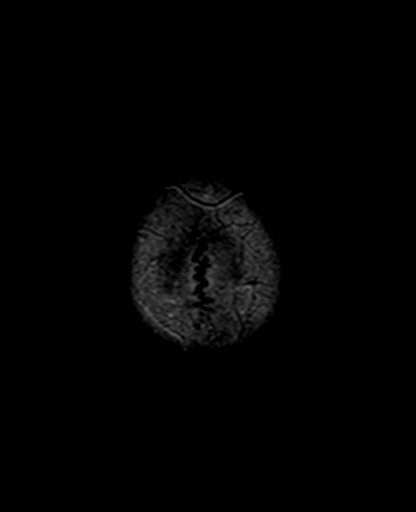

[Series 9: T2 · axial · 5.0mm · 1.20mm/px · z∈[-75,+91]mm · 3 of 27 slices shown (2 of 3)]
[im 1/27]
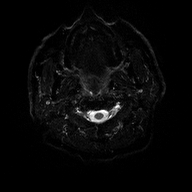
[im 14/27]
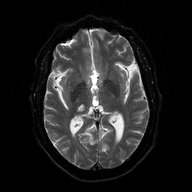
[im 27/27]
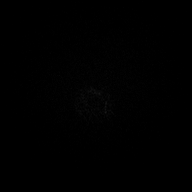

[Series 10: GRE · axial · 5.0mm · 0.45mm/px · z∈[-69,+85]mm · 3 of 25 slices shown (2 of 2)]
[im 1/25]
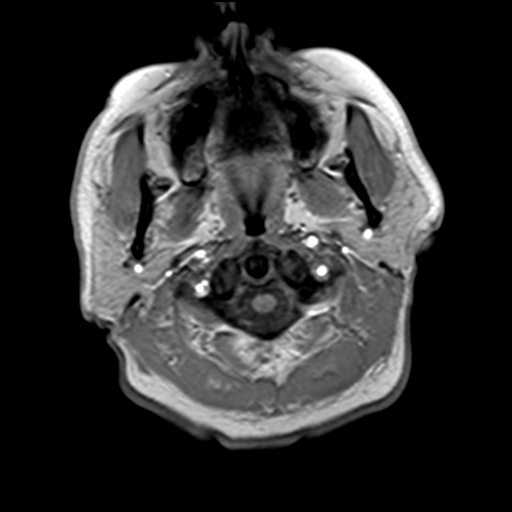
[im 13/25]
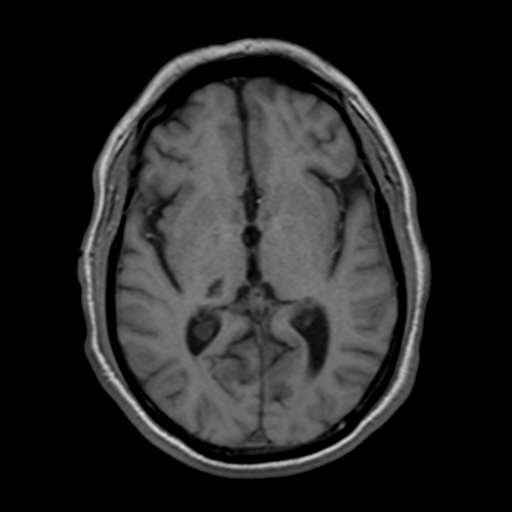
[im 25/25]
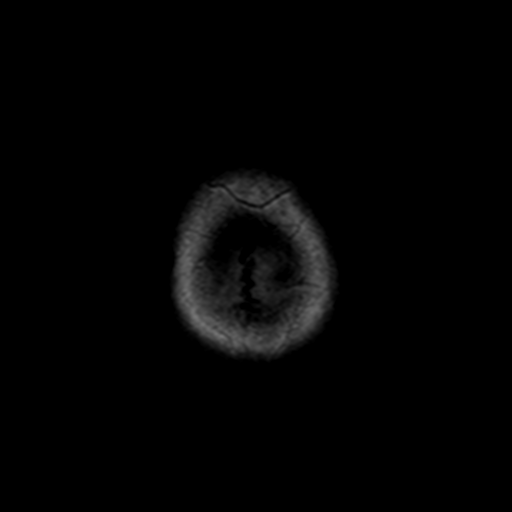

[Series 11: T2 · coronal · 5.0mm · 0.45mm/px · 3 of 30 slices shown (3 of 3)]
[im 1/30]
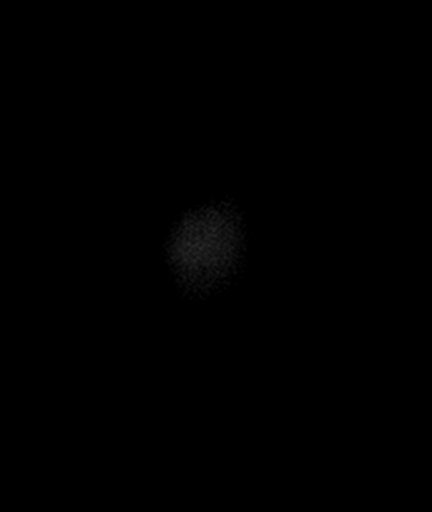
[im 15/30]
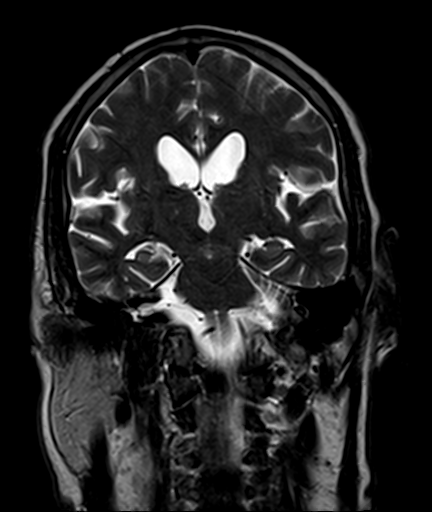
[im 30/30]
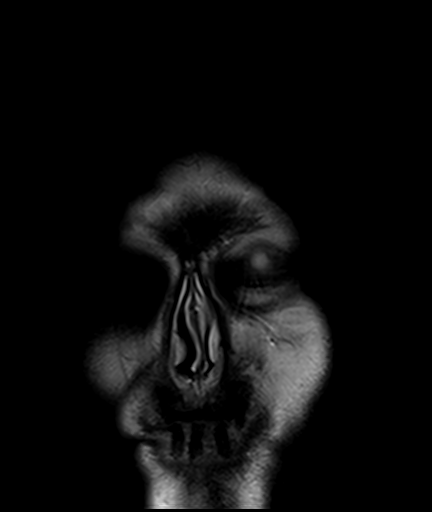

[Series 15: T1 post-contrast · sagittal · 5.0mm · 0.45mm/px · 3 of 29 slices shown]
[im 1/29]
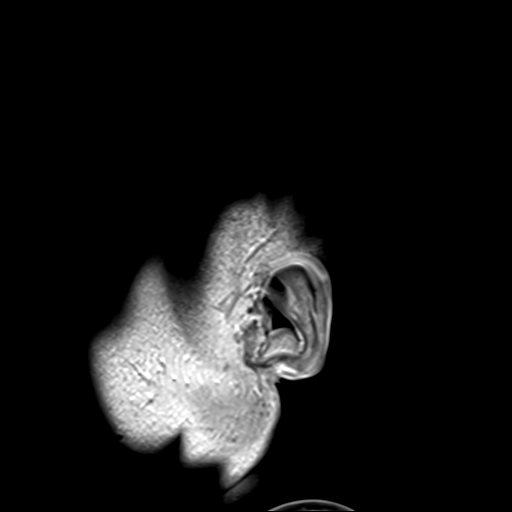
[im 15/29]
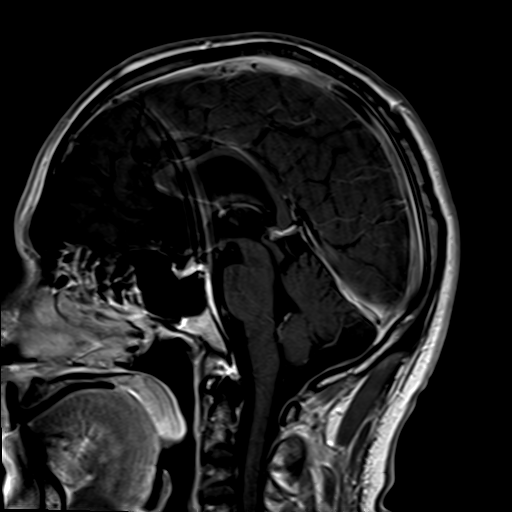
[im 29/29]
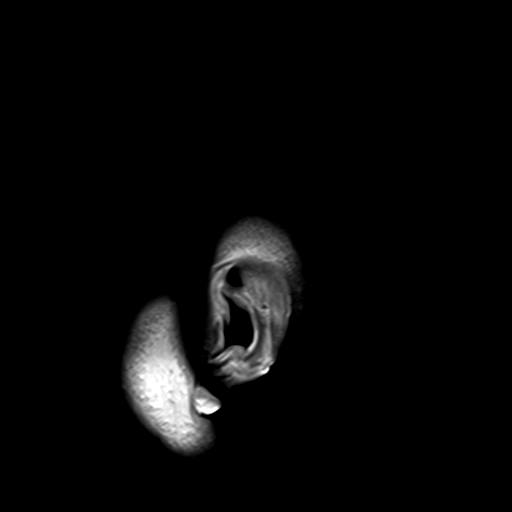

[Series 100: DWI · axial · 3.0mm · 1.80mm/px · z∈[-79,+93]mm · 6 of 59 slices shown (3 of 4)]
[im 1/59]
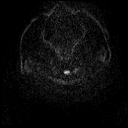
[im 12/59]
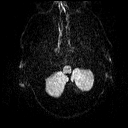
[im 24/59]
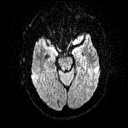
[im 35/59]
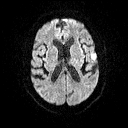
[im 47/59]
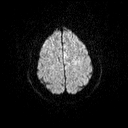
[im 59/59]
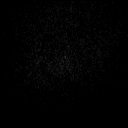

[Series 101: DWI · coronal · 3.0mm · 1.80mm/px · 5 of 49 slices shown (4 of 4)]
[im 1/49]
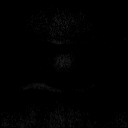
[im 13/49]
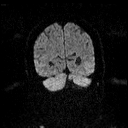
[im 25/49]
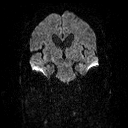
[im 37/49]
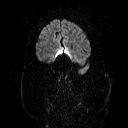
[im 49/49]
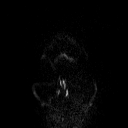

[42 of 48 positions shown; findings below may reference images not displayed]

FINDINGS: Brain: No diffusion restriction to suggest acute infarct. No
abnormal susceptibility hypointensity to indicate intracranial
hemorrhage. There is a focus of encephalomalacia within right
thalamus and asymmetric atrophy of right hippocampal formation
compatible with chronic infarction. No abnormal enhancement of the
brain parenchyma.

Extra-axial space: No hydrocephalus. No midline shift. No effacement
of basilar cisterns. No extra-axial collection is identified.
Proximal intracranial flow voids are maintained. No abnormality of
the cervical medullary junction.

Dural mass centered over the left operculum measuring 17 x 11 x 18
mm (AP x ML x CC), series 13, image 16 and series 14, image 16.
Associated with the mass is the tail of dural thickening extending
over the left convexity. Minimal mass effect on the underlying
cortex. No cortical edema. No appreciable osseous invasion.

Other: Mild diffuse paranasal sinus thickening. No abnormal signal
of the mastoid air cells. Orbits are unremarkable. Calvarium is
unremarkable.
IMPRESSION: 1. Focus of encephalomalacia in right thalamus and asymmetric
atrophy of right hippocampus compatible with chronic infarction.
2. Dural mass centered over left operculum with tail of dural
thickening over the left convexity is likely a meningioma. Minimal
mass effect on brain. No edema in underlying cortex. No appreciable
osseous invasion.
3. Mild paranasal sinus disease.
4. No evidence of acute infarct or intracranial hemorrhage.

By: Ggagsg Kami M.D.

## 2020-03-25 ENCOUNTER — Ambulatory Visit: Payer: Self-pay | Attending: Internal Medicine

## 2020-03-27 ENCOUNTER — Ambulatory Visit: Payer: Self-pay | Attending: Internal Medicine

## 2020-03-27 DIAGNOSIS — Z23 Encounter for immunization: Secondary | ICD-10-CM

## 2020-03-27 NOTE — Progress Notes (Signed)
   Covid-19 Vaccination Clinic  Name:  Victor Calhoun    MRN: [id number] DOB: May 24, 1956  03/27/2020  Mr. Victor Calhoun was observed post Covid-19 immunization for 30 minutes  without incident. He was provided with Vaccine Information Sheet and instruction to access the V-Safe system.   Mr. Victor Calhoun was instructed to call 911 with any severe reactions post vaccine: Marland Kitchen Difficulty breathing  . Swelling of face and throat  . A fast heartbeat  . A bad rash all over body  . Dizziness and weakness   Immunizations Administered    Name Date Dose VIS Date Route   Pfizer COVID-19 Vaccine 03/27/2020  1:46 PM 0.3 mL 12/08/2019 Intramuscular   Manufacturer: [employer], [employer]   Lot: [id number]   [place]: [id number]

## 2020-04-21 ENCOUNTER — Ambulatory Visit: Payer: Self-pay | Attending: Internal Medicine

## 2020-04-21 DIAGNOSIS — Z23 Encounter for immunization: Secondary | ICD-10-CM

## 2020-04-21 NOTE — Progress Notes (Signed)
   Covid-19 Vaccination Clinic  Name:  Victor Calhoun    MRN: [id number] DOB: Aug 06, 1956  04/21/2020  Mr. Victor Calhoun was observed post Covid-19 immunization for 15 minutes without incident. He was provided with Vaccine Information Sheet and instruction to access the V-Safe system.   Mr. Victor Calhoun was instructed to call 911 with any severe reactions post vaccine: Marland Kitchen Difficulty breathing  . Swelling of face and throat  . A fast heartbeat  . A bad rash all over body  . Dizziness and weakness   Immunizations Administered    Name Date Dose VIS Date Route   Pfizer COVID-19 Vaccine 04/21/2020 12:26 PM 0.3 mL 02/21/2019 Intramuscular   Manufacturer: [employer], [employer]   Lot: [id number]   [place]: [id number]
